# Patient Record
Sex: Male | Born: 1940 | Race: White | Hispanic: No | Marital: Married | Smoking: Former smoker
Health system: Southern US, Community
[De-identification: ages and names within clinical notes are randomized; demographics above are authoritative.]

## PROBLEM LIST (undated history)

## (undated) DIAGNOSIS — I10 Essential (primary) hypertension: Secondary | ICD-10-CM

## (undated) DIAGNOSIS — Z970 Presence of artificial eye: Secondary | ICD-10-CM

## (undated) DIAGNOSIS — C6290 Malignant neoplasm of unspecified testis, unspecified whether descended or undescended: Secondary | ICD-10-CM

## (undated) HISTORY — DX: Malignant neoplasm of unspecified testis, unspecified whether descended or undescended: C62.90

## (undated) HISTORY — PX: HEMORRHOID SURGERY: SHX153

## (undated) HISTORY — PX: OTHER SURGICAL HISTORY: SHX169

## (undated) HISTORY — PX: BREAST SURGERY: SHX581

## (undated) HISTORY — PX: ORCHIECTOMY: SHX2116

## (undated) HISTORY — PX: CATARACT EXTRACTION, BILATERAL: SHX1313

## (undated) HISTORY — PX: TONSILLECTOMY: SUR1361

---

## 2001-01-10 ENCOUNTER — Encounter: Payer: Self-pay | Admitting: Family Medicine

## 2001-01-10 ENCOUNTER — Ambulatory Visit (HOSPITAL_COMMUNITY): Admission: RE | Admit: 2001-01-10 | Discharge: 2001-01-10 | Payer: Self-pay | Admitting: Family Medicine

## 2002-05-30 ENCOUNTER — Encounter: Payer: Self-pay | Admitting: Family Medicine

## 2002-05-30 ENCOUNTER — Ambulatory Visit (HOSPITAL_COMMUNITY): Admission: RE | Admit: 2002-05-30 | Discharge: 2002-05-30 | Payer: Self-pay | Admitting: Family Medicine

## 2011-04-22 ENCOUNTER — Encounter: Payer: Self-pay | Admitting: Gastroenterology

## 2014-01-03 ENCOUNTER — Encounter: Payer: Self-pay | Admitting: Gastroenterology

## 2014-05-08 ENCOUNTER — Ambulatory Visit
Admission: RE | Admit: 2014-05-08 | Discharge: 2014-05-08 | Disposition: A | Discharge status: Home / Self Care | Payer: 59 | Source: Ambulatory Visit | Attending: Family Medicine | Admitting: Family Medicine

## 2014-05-08 ENCOUNTER — Other Ambulatory Visit: Payer: Self-pay | Admitting: Family Medicine

## 2014-05-08 DIAGNOSIS — J9801 Acute bronchospasm: Secondary | ICD-10-CM

## 2014-08-22 ENCOUNTER — Encounter: Payer: Self-pay | Admitting: Gastroenterology

## 2015-11-28 ENCOUNTER — Other Ambulatory Visit: Payer: Self-pay | Admitting: Family Medicine

## 2015-11-28 ENCOUNTER — Ambulatory Visit
Admission: RE | Admit: 2015-11-28 | Discharge: 2015-11-28 | Disposition: A | Discharge status: Home / Self Care | Payer: PPO | Source: Ambulatory Visit | Attending: Family Medicine | Admitting: Family Medicine

## 2015-11-28 DIAGNOSIS — M545 Low back pain, unspecified: Secondary | ICD-10-CM

## 2015-11-28 DIAGNOSIS — G8929 Other chronic pain: Secondary | ICD-10-CM

## 2016-03-17 DIAGNOSIS — H60333 Swimmer's ear, bilateral: Secondary | ICD-10-CM | POA: Diagnosis not present

## 2016-03-17 DIAGNOSIS — H6123 Impacted cerumen, bilateral: Secondary | ICD-10-CM | POA: Diagnosis not present

## 2016-03-17 DIAGNOSIS — J342 Deviated nasal septum: Secondary | ICD-10-CM | POA: Diagnosis not present

## 2016-03-17 DIAGNOSIS — J310 Chronic rhinitis: Secondary | ICD-10-CM | POA: Diagnosis not present

## 2016-09-17 ENCOUNTER — Other Ambulatory Visit: Payer: Self-pay

## 2016-09-17 DIAGNOSIS — R030 Elevated blood-pressure reading, without diagnosis of hypertension: Secondary | ICD-10-CM | POA: Insufficient documentation

## 2016-09-17 DIAGNOSIS — F17201 Nicotine dependence, unspecified, in remission: Secondary | ICD-10-CM | POA: Insufficient documentation

## 2016-09-17 DIAGNOSIS — E7800 Pure hypercholesterolemia, unspecified: Secondary | ICD-10-CM | POA: Insufficient documentation

## 2016-09-17 DIAGNOSIS — K449 Diaphragmatic hernia without obstruction or gangrene: Secondary | ICD-10-CM | POA: Insufficient documentation

## 2016-09-17 DIAGNOSIS — J309 Allergic rhinitis, unspecified: Secondary | ICD-10-CM | POA: Insufficient documentation

## 2016-09-17 DIAGNOSIS — K625 Hemorrhage of anus and rectum: Secondary | ICD-10-CM | POA: Insufficient documentation

## 2016-09-17 DIAGNOSIS — K219 Gastro-esophageal reflux disease without esophagitis: Secondary | ICD-10-CM | POA: Insufficient documentation

## 2016-09-17 DIAGNOSIS — N529 Male erectile dysfunction, unspecified: Secondary | ICD-10-CM | POA: Insufficient documentation

## 2016-09-17 DIAGNOSIS — M545 Low back pain, unspecified: Secondary | ICD-10-CM | POA: Insufficient documentation

## 2016-09-17 DIAGNOSIS — N400 Enlarged prostate without lower urinary tract symptoms: Secondary | ICD-10-CM | POA: Insufficient documentation

## 2016-09-17 DIAGNOSIS — R972 Elevated prostate specific antigen [PSA]: Secondary | ICD-10-CM | POA: Insufficient documentation

## 2016-09-17 DIAGNOSIS — Z8547 Personal history of malignant neoplasm of testis: Secondary | ICD-10-CM | POA: Insufficient documentation

## 2016-09-17 DIAGNOSIS — J9801 Acute bronchospasm: Secondary | ICD-10-CM | POA: Insufficient documentation

## 2016-09-18 ENCOUNTER — Ambulatory Visit (INDEPENDENT_AMBULATORY_CARE_PROVIDER_SITE_OTHER): Payer: PPO | Admitting: Internal Medicine

## 2016-09-18 ENCOUNTER — Ambulatory Visit (INDEPENDENT_AMBULATORY_CARE_PROVIDER_SITE_OTHER)
Admission: RE | Admit: 2016-09-18 | Discharge: 2016-09-18 | Disposition: A | Discharge status: Home / Self Care | Payer: PPO | Source: Ambulatory Visit | Attending: Internal Medicine | Admitting: Internal Medicine

## 2016-09-18 ENCOUNTER — Encounter: Payer: Self-pay | Admitting: Internal Medicine

## 2016-09-18 ENCOUNTER — Other Ambulatory Visit (INDEPENDENT_AMBULATORY_CARE_PROVIDER_SITE_OTHER): Payer: PPO

## 2016-09-18 VITALS — BP 152/70 | HR 55 | Ht 68.0 in | Wt 169.6 lb

## 2016-09-18 DIAGNOSIS — R059 Cough, unspecified: Secondary | ICD-10-CM

## 2016-09-18 DIAGNOSIS — R05 Cough: Secondary | ICD-10-CM

## 2016-09-18 DIAGNOSIS — R0602 Shortness of breath: Secondary | ICD-10-CM | POA: Diagnosis not present

## 2016-09-18 DIAGNOSIS — J45991 Cough variant asthma: Secondary | ICD-10-CM | POA: Insufficient documentation

## 2016-09-18 LAB — CBC WITH DIFFERENTIAL/PLATELET
BASOS PCT: 0.6 % (ref 0.0–3.0)
Basophils Absolute: 0.1 10*3/uL (ref 0.0–0.1)
EOS ABS: 0.2 10*3/uL (ref 0.0–0.7)
Eosinophils Relative: 3.1 % (ref 0.0–5.0)
HEMATOCRIT: 43.3 % (ref 39.0–52.0)
HEMOGLOBIN: 14.7 g/dL (ref 13.0–17.0)
Lymphocytes Relative: 26.5 % (ref 12.0–46.0)
Lymphs Abs: 2.1 10*3/uL (ref 0.7–4.0)
MCHC: 34 g/dL (ref 30.0–36.0)
MCV: 88 fl (ref 78.0–100.0)
MONO ABS: 0.5 10*3/uL (ref 0.1–1.0)
Monocytes Relative: 6.2 % (ref 3.0–12.0)
Neutro Abs: 5 10*3/uL (ref 1.4–7.7)
Neutrophils Relative %: 63.6 % (ref 43.0–77.0)
Platelets: 233 10*3/uL (ref 150.0–400.0)
RBC: 4.92 Mil/uL (ref 4.22–5.81)
RDW: 13.8 % (ref 11.5–15.5)
WBC: 7.8 10*3/uL (ref 4.0–10.5)

## 2016-09-18 LAB — NITRIC OXIDE: NITRIC OXIDE: 145

## 2016-09-18 MED ORDER — PREDNISONE 10 MG PO TABS: ORAL_TABLET | ORAL | 0 refills | Status: DC

## 2016-09-18 MED ORDER — FAMOTIDINE 20 MG PO TABS: ORAL_TABLET | ORAL | Status: DC

## 2016-09-18 NOTE — Patient Instructions (Signed)
Prednisone 10 mg take  4 each am x 2 days,   2 each am x 2 days,  1 each am x 2 days and stop   Prevacid 30 mg    Take  30-60 min before first meal of the day and Pepcid (famotidine)  20 mg one @  bedtime until return to office - this is the best way to tell whether stomach acid is contributing to your problem.    GERD (REFLUX)  is an extremely common cause of respiratory symptoms just like yours , many times with no obvious heartburn at all.    It can be treated with medication, but also with lifestyle changes including elevation of the head of your bed (ideally with 6 inch  bed blocks),  Smoking cessation, avoidance of late meals, excessive alcohol, and avoid fatty foods, chocolate, peppermint, colas, red wine, and acidic juices such as orange juice.  NO MINT OR MENTHOL PRODUCTS SO NO COUGH DROPS   USE SUGARLESS CANDY INSTEAD (Jolley ranchers or Stover's or Life Savers) or even ice chips will also do - the key is to swallow to prevent all throat clearing. NO OIL BASED VITAMINS - use powdered substitutes.  Please see patient coordinator before you leave today  to schedule sinus CT   Please remember to go to the lab and x-ray department downstairs for your tests - we will call you with the results when they are available.     Please schedule a follow up office visit in 2 weeks, sooner if needed

## 2016-09-18 NOTE — Progress Notes (Signed)
Subjective:    Patient ID: Juan Parrish, male    DOB: 1941-05-17,   MRN: [id number]  HPI  64 yowm light smoker quit 2014 s obvious sequelae with onset sob/wheezing fall 2016 referred to pulmonary clinic 09/18/2016 by Dr   Maury Dus   09/18/2016 1st [facility] office visit/ Juan Parrish   Chief Complaint  Patient presents with  . PULMONARY CONSULT    Referred by Dr. Maury Dus. Pt c/o wheezing, cough and SOB for the past 10 months. He states he has to clear his throat frequently, and sputum is clear in color. He states that he gets winded with exertion such as push mowing, but is able to perform desired activities without his breathing stopping him.   onset fall 2016 with sensation of pnds much worse at bedtime assoc with daytime sense of wheeze but very little acutal mucus production and really Not limited by breathing from desired activities  Even though admits more sob with exertion   No response to singulair / nasocort/ powder inhaler/ symbicort  On prevacid x years not sure of dose but takes in evening not timed with meals   No obvious  day to day or daytime variabilty or assoc chronic cough or cp or chest tightness, subjective wheeze overt sinus or hb symptoms. No unusual exp hx or h/o childhood pna/ asthma or knowledge of premature birth.  Sleeping ok without nocturnal  or early am exacerbation  of respiratory  c/o's or need for noct saba. Also denies any obvious fluctuation of symptoms with weather or environmental changes or other aggravating or alleviating factors except as outlined above   Current Medications, Allergies, Complete Past Medical History, Past Surgical History, Family History, and Social History were reviewed in [employer] record.          Review of Systems  Constitutional: Negative for activity change, appetite change, chills, fever and unexpected weight change.  HENT: Negative for congestion, dental problem, postnasal drip,  rhinorrhea, sneezing, sore throat, trouble swallowing and voice change.   Eyes: Negative for visual disturbance.  Respiratory: Positive for cough and shortness of breath. Negative for choking.   Cardiovascular: Negative for chest pain and leg swelling.  Gastrointestinal: Negative for abdominal pain, nausea and vomiting.  Genitourinary: Negative for difficulty urinating.  Musculoskeletal: Negative for arthralgias.  Skin: Negative for rash.  Psychiatric/Behavioral: Negative for behavioral problems and confusion.       Objective:   Physical Exam   amb wf nad with classic pseudowheeze   Wt Readings from Last 3 Encounters:  09/18/16 169 lb 9.6 oz (76.9 kg)    Vital signs reviewed    HEENT: nl dentition, turbinates, and oropharynx with min mucoid pnd. Nl external ear canals without cough reflex   NECK :  without JVD/Nodes/TM/ nl carotid upstrokes bilaterally   LUNGS: no acc muscle use,  Nl contour chest which is clear to A and P with prominent pseudowheeze  CV:  RRR  no s3 or murmur or increase in P2, no edema   ABD:  soft and nontender with nl inspiratory excursion in the supine position. No bruits or organomegaly, bowel sounds nl  MS:  Nl gait/ ext warm without deformities, calf tenderness, cyanosis or clubbing No obvious joint restrictions   SKIN: warm and dry without lesions    NEURO:  alert, approp, nl sensorium with  no motor deficits   CXR PA and Lateral:   09/18/2016 :    I personally reviewed images and  agree with radiology impression as follows:    There is slight left base atelectasis. Lungs elsewhere are clear. Heart size and pulmonary vascularity are normal. No adenopathy. There is mild degenerative change in the thoracic spine My review : no significant atx .   Labs ordered 09/18/2016 = allergy profile    Assessment & Plan:

## 2016-09-19 NOTE — Assessment & Plan Note (Signed)
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 Related to poorly controlled rhinitis or reflux or both  The most common causes of chronic cough in immunocompetent adults include the following: upper airway cough syndrome (UACS), previously referred to as postnasal drip syndrome (PNDS), which is caused by variety of rhinosinus conditions; (2) asthma; (3) GERD; (4) chronic bronchitis from cigarette smoking or other inhaled environmental irritants; (5) nonasthmatic eosinophilic bronchitis; and (6) bronchiectasis.   These conditions, singly or in combination, have accounted for up to 94% of the causes of chronic cough in prospective studies.   Other conditions have constituted no >6% of the causes in prospective studies These have included bronchogenic carcinoma, chronic interstitial pneumonia, sarcoidosis, left ventricular failure, ACEI-induced cough, and aspiration from a condition associated with pharyngeal dysfunction.    Chronic cough is often simultaneously caused by more than one condition. A single cause has been found from 38 to 82% of the time, multiple causes from 18 to 62%. Multiply caused cough has been the result of three diseases up to 42% of the time.       Suspect this problems is therefore multifactorial and will start with just a short course of prednsone and w/u for allergy while max rx for gerd / sinus dz and regroup in just 2 weeks - in meantime needs to work hard on not clearing his throat which just amplifies the problem of cyclical cough   Total time devoted to counseling  = 35/2m review case with pt/ discussion of options/alternatives/ personally creating written instructions  in presence of pt  then going over those specific  Instructions directly with the pt including how to use all of the meds but  in particular covering each new medication in detail and the difference between the maintenance/automatic meds and the prns using an action plan format for the latter.

## 2016-09-21 LAB — RESPIRATORY ALLERGY PROFILE REGION II ~~LOC~~
Allergen, A. alternata, m6: <0.1 kU/L
Allergen, C. Herbarum, M2: <0.1 kU/L
Allergen, D pternoyssinus,d7: <0.1 kU/L
Allergen, Mulberry, t76: <0.1 kU/L
Allergen, Oak,t7: <0.1 kU/L
Box Elder IgE: <0.1 kU/L
Cat Dander: <0.1 kU/L
Cockroach: <0.1 kU/L
D. farinae: <0.1 kU/L
Dog Dander: <0.1 kU/L
IGE (IMMUNOGLOBULIN E), SERUM: 112 kU/L (ref <115)
Johnson Grass: <0.1 kU/L
Pecan/Hickory Tree IgE: <0.1 kU/L
Rough Pigweed  IgE: <0.1 kU/L
Sheep Sorrel IgE: <0.1 kU/L
Timothy Grass: <0.1 kU/L

## 2016-09-21 NOTE — Progress Notes (Signed)
Spoke with pt and notified of results per Dr. Wert. Pt verbalized understanding and denied any questions. 

## 2016-09-22 ENCOUNTER — Ambulatory Visit (INDEPENDENT_AMBULATORY_CARE_PROVIDER_SITE_OTHER)
Admission: RE | Admit: 2016-09-22 | Discharge: 2016-09-22 | Disposition: A | Discharge status: Home / Self Care | Payer: PPO | Source: Ambulatory Visit | Attending: Internal Medicine | Admitting: Internal Medicine

## 2016-09-22 DIAGNOSIS — R05 Cough: Secondary | ICD-10-CM

## 2016-09-22 DIAGNOSIS — J328 Other chronic sinusitis: Secondary | ICD-10-CM | POA: Diagnosis not present

## 2016-09-22 DIAGNOSIS — R059 Cough, unspecified: Secondary | ICD-10-CM

## 2016-09-23 NOTE — Progress Notes (Signed)
Spoke with the pt's spouse and notified of results and she verbalized understanding and will inform the pt

## 2016-09-29 ENCOUNTER — Other Ambulatory Visit: Payer: PPO

## 2016-09-29 ENCOUNTER — Encounter: Payer: Self-pay | Admitting: Internal Medicine

## 2016-09-29 ENCOUNTER — Ambulatory Visit (INDEPENDENT_AMBULATORY_CARE_PROVIDER_SITE_OTHER): Payer: PPO | Admitting: Internal Medicine

## 2016-09-29 VITALS — BP 126/80 | HR 58 | Ht 68.0 in | Wt 170.0 lb

## 2016-09-29 DIAGNOSIS — Z23 Encounter for immunization: Secondary | ICD-10-CM | POA: Diagnosis not present

## 2016-09-29 DIAGNOSIS — J45991 Cough variant asthma: Secondary | ICD-10-CM | POA: Diagnosis not present

## 2016-09-29 MED ORDER — MOMETASONE FURO-FORMOTEROL FUM 100-5 MCG/ACT IN AERO: 2.0000 | INHALATION_SPRAY | Freq: Two times a day (BID) | RESPIRATORY_TRACT | 11 refills | Status: DC

## 2016-09-29 MED ORDER — MOMETASONE FURO-FORMOTEROL FUM 100-5 MCG/ACT IN AERO: 2.0000 | INHALATION_SPRAY | Freq: Two times a day (BID) | RESPIRATORY_TRACT | 0 refills | Status: DC

## 2016-09-29 NOTE — Assessment & Plan Note (Signed)
Spirometry 09/18/2016  wnl including fef 25-75 while actively "wheezing" -  FENO 09/18/2016  =   145 - Allergy profile 09/18/16  >  Eos 0.2 /  IgE  112 neg RAST  - Sinus CT 09/22/16 > Mild opacification and a superior anterior left ethmoid air cell. Paranasal sinuses elsewhere clear on this limited study. 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  Please see instructions for details which were reviewed in writing and the patient given a copy.     Pulmonary f/u is prn

## 2016-09-29 NOTE — Progress Notes (Signed)
Subjective:    Patient ID: Juan Parrish, male    DOB: May 20, 1941,   MRN: [id number]    Brief patient profile:  89 yowm light smoker quit 2014 s obvious sequelae with onset sob/wheezing fall 2016 referred to pulmonary clinic 09/18/2016 by Dr   Maury Dus  History of Present Illness  09/18/2016 1st [facility] office visit/ Wert   Chief Complaint  Patient presents with  . PULMONARY CONSULT    Referred by Dr. Maury Dus. Pt c/o wheezing, cough and SOB for the past 10 months. He states he has to clear his throat frequently, and sputum is clear in color. He states that he gets winded with exertion such as push mowing, but is able to perform desired activities without his breathing stopping him.   onset fall 2016 with sensation of pnds much worse at bedtime assoc with daytime sense of wheeze but very little acutal mucus production and really Not limited by breathing from desired activities  Even though admits more sob with exertion  No response to singulair / nasocort/ powder inhaler/ symbicort  On prevacid x years not sure of dose but takes in evening not timed with meals rec Prednisone 10 mg take  4 each am x 2 days,   2 each am x 2 days,  1 each am x 2 days and stop  Prevacid 30 mg    Take  30-60 min before first meal of the day and Pepcid (famotidine)  20 mg one @  bedtime until return to office - this is the best way to tell whether stomach acid is contributing to your problem.   GERD diet  Please see patient coordinator before you leave today  to schedule sinus CT  Please remember to go to the lab and x-ray department downstairs for your tests - we will call you with the results when they are available.    09/29/2016  f/u ov/Wert re: cough since fall on 2016  ? Cough variant asthma vs uacs  Chief Complaint  Patient presents with  . Follow-up    Cough has improved some. No new co's today.   50% improved but still present esp at hs assoc with pnds Not limited by breathing  from desired activities    No obvious day to day or daytime variability or assoc excess/ purulent sputum or mucus plugs or hemoptysis or cp or chest tightness, subjective wheeze or overt sinus or hb symptoms. No unusual exp hx or h/o childhood pna/ asthma or knowledge of premature birth.  Sleeping ok without nocturnal  or early am exacerbation  of respiratory  c/o's or need for noct saba. Also denies any obvious fluctuation of symptoms with weather or environmental changes or other aggravating or alleviating factors except as outlined above   Current Medications, Allergies, Complete Past Medical History, Past Surgical History, Family History, and Social History were reviewed in [employer] record.  ROS  The following are not active complaints unless bolded sore throat, dysphagia, dental problems, itching, sneezing,  nasal congestion or excess/ purulent secretions, ear ache,   fever, chills, sweats, unintended wt loss, classically pleuritic or exertional cp,  orthopnea pnd or leg swelling, presyncope, palpitations, abdominal pain, anorexia, nausea, vomiting, diarrhea  or change in bowel or bladder habits, change in stools or urine, dysuria,hematuria,  rash, arthralgias, visual complaints, headache, numbness, weakness or ataxia or problems with walking or coordination,  change in mood/affect or memory.  Objective:   Physical Exam   amb wf nad      Wt Readings from Last 3 Encounters:  09/29/16 170 lb (77.1 kg)  09/18/16 169 lb 9.6 oz (76.9 kg)    Vital signs reviewed   HEENT: nl dentition, turbinates, and oropharynx with min mucoid pnd. Nl external ear canals without cough reflex   NECK :  without JVD/Nodes/TM/ nl carotid upstrokes bilaterally   LUNGS: no acc muscle use,  Nl contour chest which is clear to A and P with less pseudowheeze  / some end exp wheeze  CV:  RRR  no s3 or murmur or increase in P2, no edema   ABD:  soft and  nontender with nl inspiratory excursion in the supine position. No bruits or organomegaly, bowel sounds nl  MS:  Nl gait/ ext warm without deformities, calf tenderness, cyanosis or clubbing No obvious joint restrictions   SKIN: warm and dry without lesions    NEURO:  alert, approp, nl sensorium with  no motor deficits   CXR PA and Lateral:   09/18/2016 :    I personally reviewed images and agree with radiology impression as follows:    There is slight left base atelectasis. Lungs elsewhere are clear. Heart size and pulmonary vascularity are normal. No adenopathy. There is mild degenerative change in the thoracic spine My review : no significant atx .        Assessment & Plan:

## 2016-09-29 NOTE — Patient Instructions (Addendum)
No change in any medications except for adding dulera 100 Take 2 puffs first thing in am and then another 2 puffs about 12 hours later.   Work on inhaler technique:  relax and gently blow all the way out then take a nice smooth deep breath back in, triggering the inhaler at same time you start breathing in.  Hold for up to 5 seconds if you can. Blow out thru nose. Rinse and gargle with water when done  For drainage / throat tickle try take CHLORPHENIRAMINE  4 mg - take one every 4 hours as needed - available over the counter- may cause drowsiness so start with just a bedtime dose or two and see how you tolerate it before trying in daytime      If not better next is referral to allergy - call me for this.  If better tell Dr Alyson Ingles and your friends and stay on this regimen until you see him back for follow up

## 2016-12-23 DIAGNOSIS — M545 Low back pain: Secondary | ICD-10-CM | POA: Diagnosis not present

## 2016-12-23 DIAGNOSIS — N529 Male erectile dysfunction, unspecified: Secondary | ICD-10-CM | POA: Diagnosis not present

## 2016-12-23 DIAGNOSIS — J9801 Acute bronchospasm: Secondary | ICD-10-CM | POA: Diagnosis not present

## 2016-12-23 DIAGNOSIS — K219 Gastro-esophageal reflux disease without esophagitis: Secondary | ICD-10-CM | POA: Diagnosis not present

## 2016-12-23 DIAGNOSIS — R42 Dizziness and giddiness: Secondary | ICD-10-CM | POA: Diagnosis not present

## 2016-12-23 DIAGNOSIS — J309 Allergic rhinitis, unspecified: Secondary | ICD-10-CM | POA: Diagnosis not present

## 2016-12-23 DIAGNOSIS — E7800 Pure hypercholesterolemia, unspecified: Secondary | ICD-10-CM | POA: Diagnosis not present

## 2016-12-23 DIAGNOSIS — F17201 Nicotine dependence, unspecified, in remission: Secondary | ICD-10-CM | POA: Diagnosis not present

## 2016-12-23 DIAGNOSIS — Z1389 Encounter for screening for other disorder: Secondary | ICD-10-CM | POA: Diagnosis not present

## 2016-12-23 DIAGNOSIS — Z8547 Personal history of malignant neoplasm of testis: Secondary | ICD-10-CM | POA: Diagnosis not present

## 2016-12-23 DIAGNOSIS — N400 Enlarged prostate without lower urinary tract symptoms: Secondary | ICD-10-CM | POA: Diagnosis not present

## 2016-12-23 DIAGNOSIS — Z0000 Encounter for general adult medical examination without abnormal findings: Secondary | ICD-10-CM | POA: Diagnosis not present

## 2016-12-24 ENCOUNTER — Other Ambulatory Visit: Payer: Self-pay | Admitting: Family Medicine

## 2016-12-24 DIAGNOSIS — Z136 Encounter for screening for cardiovascular disorders: Secondary | ICD-10-CM

## 2017-01-01 ENCOUNTER — Ambulatory Visit
Admission: RE | Admit: 2017-01-01 | Discharge: 2017-01-01 | Disposition: A | Discharge status: Home / Self Care | Payer: PPO | Source: Ambulatory Visit | Attending: Family Medicine | Admitting: Family Medicine

## 2017-01-01 DIAGNOSIS — Z87891 Personal history of nicotine dependence: Secondary | ICD-10-CM | POA: Diagnosis not present

## 2017-01-01 DIAGNOSIS — Z136 Encounter for screening for cardiovascular disorders: Secondary | ICD-10-CM

## 2017-01-01 DIAGNOSIS — Z8249 Family history of ischemic heart disease and other diseases of the circulatory system: Secondary | ICD-10-CM | POA: Diagnosis not present

## 2017-01-20 DIAGNOSIS — H524 Presbyopia: Secondary | ICD-10-CM | POA: Diagnosis not present

## 2017-01-20 DIAGNOSIS — H52222 Regular astigmatism, left eye: Secondary | ICD-10-CM | POA: Diagnosis not present

## 2017-01-20 DIAGNOSIS — H5202 Hypermetropia, left eye: Secondary | ICD-10-CM | POA: Diagnosis not present

## 2017-01-25 DIAGNOSIS — R972 Elevated prostate specific antigen [PSA]: Secondary | ICD-10-CM | POA: Diagnosis not present

## 2017-03-11 DIAGNOSIS — R972 Elevated prostate specific antigen [PSA]: Secondary | ICD-10-CM | POA: Diagnosis not present

## 2017-03-11 DIAGNOSIS — K648 Other hemorrhoids: Secondary | ICD-10-CM | POA: Diagnosis not present

## 2017-03-11 DIAGNOSIS — N5201 Erectile dysfunction due to arterial insufficiency: Secondary | ICD-10-CM | POA: Diagnosis not present

## 2017-03-11 DIAGNOSIS — R3912 Poor urinary stream: Secondary | ICD-10-CM | POA: Diagnosis not present

## 2017-03-23 DIAGNOSIS — H43812 Vitreous degeneration, left eye: Secondary | ICD-10-CM | POA: Diagnosis not present

## 2017-03-23 DIAGNOSIS — H2702 Aphakia, left eye: Secondary | ICD-10-CM | POA: Diagnosis not present

## 2017-04-14 DIAGNOSIS — K648 Other hemorrhoids: Secondary | ICD-10-CM | POA: Diagnosis not present

## 2017-05-06 DIAGNOSIS — K648 Other hemorrhoids: Secondary | ICD-10-CM | POA: Diagnosis not present

## 2017-07-01 ENCOUNTER — Encounter: Payer: Self-pay | Admitting: Internal Medicine

## 2017-07-01 ENCOUNTER — Ambulatory Visit (INDEPENDENT_AMBULATORY_CARE_PROVIDER_SITE_OTHER): Payer: PPO | Admitting: Internal Medicine

## 2017-07-01 DIAGNOSIS — J45991 Cough variant asthma: Secondary | ICD-10-CM | POA: Diagnosis not present

## 2017-07-01 MED ORDER — BUDESONIDE-FORMOTEROL FUMARATE 80-4.5 MCG/ACT IN AERO: 2.0000 | INHALATION_SPRAY | Freq: Two times a day (BID) | RESPIRATORY_TRACT | 11 refills | Status: DC

## 2017-07-01 MED ORDER — BUDESONIDE-FORMOTEROL FUMARATE 160-4.5 MCG/ACT IN AERO: 2.0000 | INHALATION_SPRAY | Freq: Two times a day (BID) | RESPIRATORY_TRACT | 0 refills | Status: DC

## 2017-07-01 NOTE — Progress Notes (Signed)
Subjective:   Patient ID: Juan Parrish, male    DOB: [birth year],   MRN: [id number]    Brief patient profile:  30   yowm only a light smoker quit 2014 s obvious sequelae with onset sob/wheezing fall 2016 referred to pulmonary clinic 09/18/2016 by Dr  Maury Dus    History of Present Illness  09/18/2016 1st [facility] office visit/ Wert   Chief Complaint  Patient presents with  . PULMONARY CONSULT    Referred by Dr. Maury Dus. Pt c/o wheezing, cough and SOB for the past 10 months. He states he has to clear his throat frequently, and sputum is clear in color. He states that he gets winded with exertion such as push mowing, but is able to perform desired activities without his breathing stopping him.   onset fall 2016 with sensation of pnds much worse at bedtime assoc with daytime sense of wheeze but very little acutal mucus production and really Not limited by breathing from desired activities  Even though admits more sob with exertion  No response to singulair / nasocort/ powder inhaler/ symbicort  On prevacid x years not sure of dose but takes in evening not timed with meals rec Prednisone 10 mg take  4 each am x 2 days,   2 each am x 2 days,  1 each am x 2 days and stop  Prevacid 30 mg    Take  30-60 min before first meal of the day and Pepcid (famotidine)  20 mg one @  bedtime until return to office - this is the best way to tell whether stomach acid is contributing to your problem.   GERD diet  Please see patient coordinator before you leave today  to schedule sinus CT  Please remember to go to the lab and x-ray department downstairs for your tests - we will call you with the results when they are available.    09/29/2016  f/u ov/Wert re: cough since fall on 2016  ? Cough variant asthma vs uacs  Chief Complaint  Patient presents with  . Follow-up    Cough has improved some. No new co's today.   50% improved but still present esp at hs assoc with pnds Not limited by  breathing from desired activities   rec No change in any medications except for adding dulera 100 Take 2 puffs first thing in am and then another 2 puffs about 12 hours later.  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 Acute Visit    Pt c/o wheezing and congestion in his throat "for months" worse the past 6 wks.    symtpoms of cough / wheeze indolent persistnent daily since late  April 2018 rx with loratidine / pepcid 20 mg at hs  Did not rememer / activate any other portion of plan Main complaint is something is stuck in his throat he can't cough up but more day than noct and though has "wheeze" really Not limited by breathing from desired activities    No obvious day to day or daytime variability or assoc excess/ purulent sputum or mucus plugs or hemoptysis or cp or chest tightness,  or overt  sinus or hb symptoms. No unusual exp hx or h/o childhood pna/ asthma or knowledge of premature birth.  Sleeping ok without nocturnal  or early am exacerbation  of respiratory  c/o's or need for noct saba. Also denies any obvious fluctuation of symptoms with weather or environmental changes or other aggravating or alleviating factors except as outlined above   Current Medications, Allergies, Complete Past Medical History, Past Surgical History, Family History, and Social History were reviewed in [employer] record.  ROS  The following are not active complaints unless bolded sore throat, dysphagia, dental problems, itching, sneezing,  nasal congestion or excess/ purulent secretions, ear ache,   fever, chills, sweats, unintended wt loss, classically pleuritic or exertional cp,  orthopnea pnd or leg  swelling, presyncope, palpitations, abdominal pain, anorexia, nausea, vomiting, diarrhea  or change in bowel or bladder habits, change in stools or urine, dysuria,hematuria,  rash, arthralgias, visual complaints, headache, numbness, weakness or ataxia or problems with walking or coordination,  change in mood/affect or memory.                      Objective:   Physical Exam   amb wf nad    Wt Readings from Last 3 Encounters:  07/01/17 175 lb (79.4 kg)  09/29/16 170 lb (77.1 kg)  09/18/16 169 lb 9.6 oz (76.9 kg)    Vital signs reviewed  - Note on arrival 02 sats  97% on RA      HEENT: nl dentition, turbinates, and oropharynx with min mucoid pnd. Nl external ear canals without cough reflex   NECK :  without JVD/Nodes/TM/ nl carotid upstrokes bilaterally   LUNGS: no acc muscle use,  Nl contour with distant mid/late exp wheeze bilaterally worse with FVC with significant upper airway component    CV:  RRR  no s3 or murmur or increase in P2, no edema   ABD:  soft and nontender with nl inspiratory excursion in the supine position. No bruits or organomegaly, bowel sounds nl  MS:  Nl gait/ ext warm without deformities, calf tenderness, cyanosis or clubbing No obvious joint restrictions   SKIN: warm and dry without lesions    NEURO:  alert, approp, nl sensorium with  no motor deficits     Assessment & Plan:

## 2017-07-01 NOTE — Patient Instructions (Addendum)
With any flare of cough/ wheeze/ phlegm building in throat > symbicort 80  Take 2 puffs first thing in am and then another 2 puffs about 12 hours later.   If not improving > Prednisone 10 mg take  4 each am x 2 days,   2 each am x 2 days,  1 each am x 2 days and stop   If still not improving > prilosec 20 mg x 2 take Take 30-60 min before first meal of the day   For drainage / throat tickle try take CHLORPHENIRAMINE  4 mg - take one every 4 hours as needed - available over the counter- may cause drowsiness so start with just a bedtime dose or two and see how you tolerate it before trying in daytime     If you are satisfied with your treatment plan,  let your doctor know and he/she can either refill your medications or you can return here when your prescription runs out.     If in any way you are not 100% satisfied,  please tell us.  If 100% better, tell your friends!  Pulmonary follow up is as needed

## 2017-07-02 NOTE — Assessment & Plan Note (Addendum)
Spirometry 09/18/2016  wnl including fef 25-75 while actively "wheezing" -  FENO 09/18/2016  =   145 - Allergy profile 09/18/16  >  Eos 0.2 /  IgE  112 neg RAST  - Sinus CT 09/22/16 > Mild opacification and a superior anterior left ethmoid air cell. Paranasal sinuses elsewhere clear on this limited study. No air-fluid level  - 09/29/2016    try dulera 100 2bid > stopped s flare until 03/2017  - 07/01/2017  After extensive coaching HFA effectiveness =  75%      With elevated FENO in past he almost certainly has an asthmatic component but it is very mild as as per the most recent studies  Ok to use short term laba/ics in this setting and if not effective add ppi in am to cover any uacs component with pred x 6 days in reserve if doesn't quickly respond to symbicort and 1st gen h1 to address sensation of pnds with f/u prn < 100% response    I had an extended discussion with the patient reviewing all relevant studies completed to date and  lasting 15 to 20 minutes of a 25 minute acute  visit    Each maintenance medication was reviewed in detail including most importantly the difference between maintenance and prns and under what circumstances the prns are to be triggered using an action plan format that is not reflected in the computer generated alphabetically organized AVS.    Please see AVS for specific instructions unique to this visit that I personally wrote and verbalized to the the pt in detail and then reviewed with pt  by my nurse highlighting any  changes in therapy recommended at today's visit to their plan of care.

## 2017-07-29 ENCOUNTER — Telehealth: Payer: Self-pay | Admitting: Internal Medicine

## 2017-07-29 ENCOUNTER — Other Ambulatory Visit: Payer: Self-pay | Admitting: Internal Medicine

## 2017-07-29 DIAGNOSIS — R05 Cough: Secondary | ICD-10-CM

## 2017-07-29 DIAGNOSIS — R059 Cough, unspecified: Secondary | ICD-10-CM

## 2017-07-29 MED ORDER — PREDNISONE 10 MG PO TABS: ORAL_TABLET | ORAL | 0 refills | Status: DC

## 2017-07-29 NOTE — Telephone Encounter (Signed)
Dr Melvyn Novas, pt wife calling in stating that the patient is needing the Prednisone taper that was offered last OV. Symptoms are not improving.   With any flare of cough/ wheeze/ phlegm building in throat > symbicort 80  Take 2 puffs first thing in am and then another 2 puffs about 12 hours later.   If not improving > Prednisone 10 mg take  4 each am x 2 days,   2 each am x 2 days,  1 each am x 2 days and stop   If still not improving > prilosec 20 mg x 2 take Take 30-60 min before first meal of the day   For drainage / throat tickle try take CHLORPHENIRAMINE  4 mg - take one every 4 hours as needed - available over the counter- may cause drowsiness so start with just a bedtime dose or two and see how you tolerate it before trying in daytime     If you are satisfied with your treatment plan,  let your doctor know and he/she can either refill your medications or you can return here when your prescription runs out.     If in any way you are not 100% satisfied,  please tell us.  If 100% better, tell your friends!  Pulmonary follow up is as needed   Please advise if Prednisone taper is okay to send at this point as stated per last OV. Thanks.

## 2017-07-29 NOTE — Telephone Encounter (Signed)
Saw that pt was requesting refill of presdnisone taper. Called pt and left a message for him to call us back so we can figure out exactly if it is the prednisone taper he is needing or if it is a maintenance dose of the prednisone that he is needing instead.

## 2017-07-29 NOTE — Telephone Encounter (Signed)
Spoke with pt's wife and informed her of MW's message. She verified which pharmacy rx needed to be sent to. Rx was sent. She had no additional questions at this time. Nothing further is needed

## 2017-07-29 NOTE — Telephone Encounter (Signed)
Prednisone 10 mg take  4 each am x 2 days,   2 each am x 2 days,  1 each am x 2 days and stop  

## 2017-08-14 DIAGNOSIS — Z970 Presence of artificial eye: Secondary | ICD-10-CM | POA: Diagnosis not present

## 2017-08-14 DIAGNOSIS — H2702 Aphakia, left eye: Secondary | ICD-10-CM | POA: Diagnosis not present

## 2017-08-14 DIAGNOSIS — H16102 Unspecified superficial keratitis, left eye: Secondary | ICD-10-CM | POA: Diagnosis not present

## 2017-08-15 IMAGING — DX DG CHEST 2V
2 series · 2 of 2 positions shown · non-contrast
Comparison: May 08, 2014

CLINICAL DATA: Shortness of breath with cough and congestion ;
wheezing

EXAM:
CHEST  2 VIEW

[chest pa]
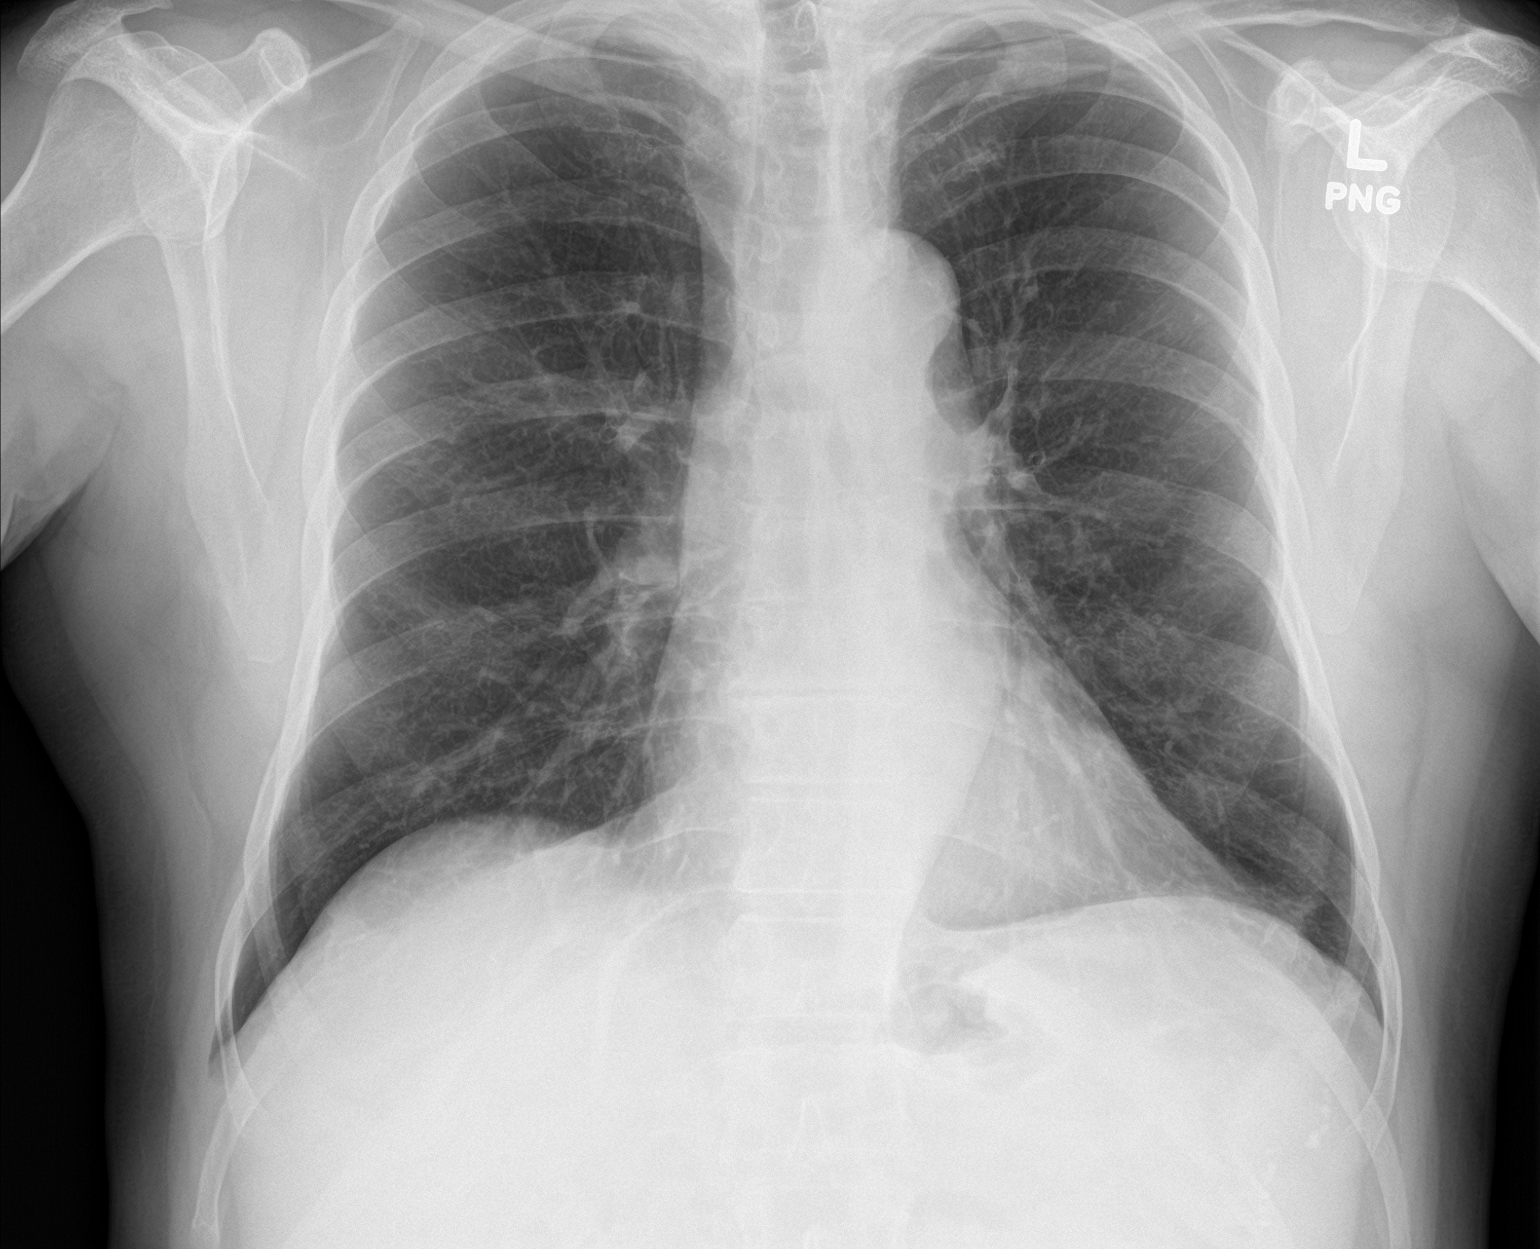

[chest lat]
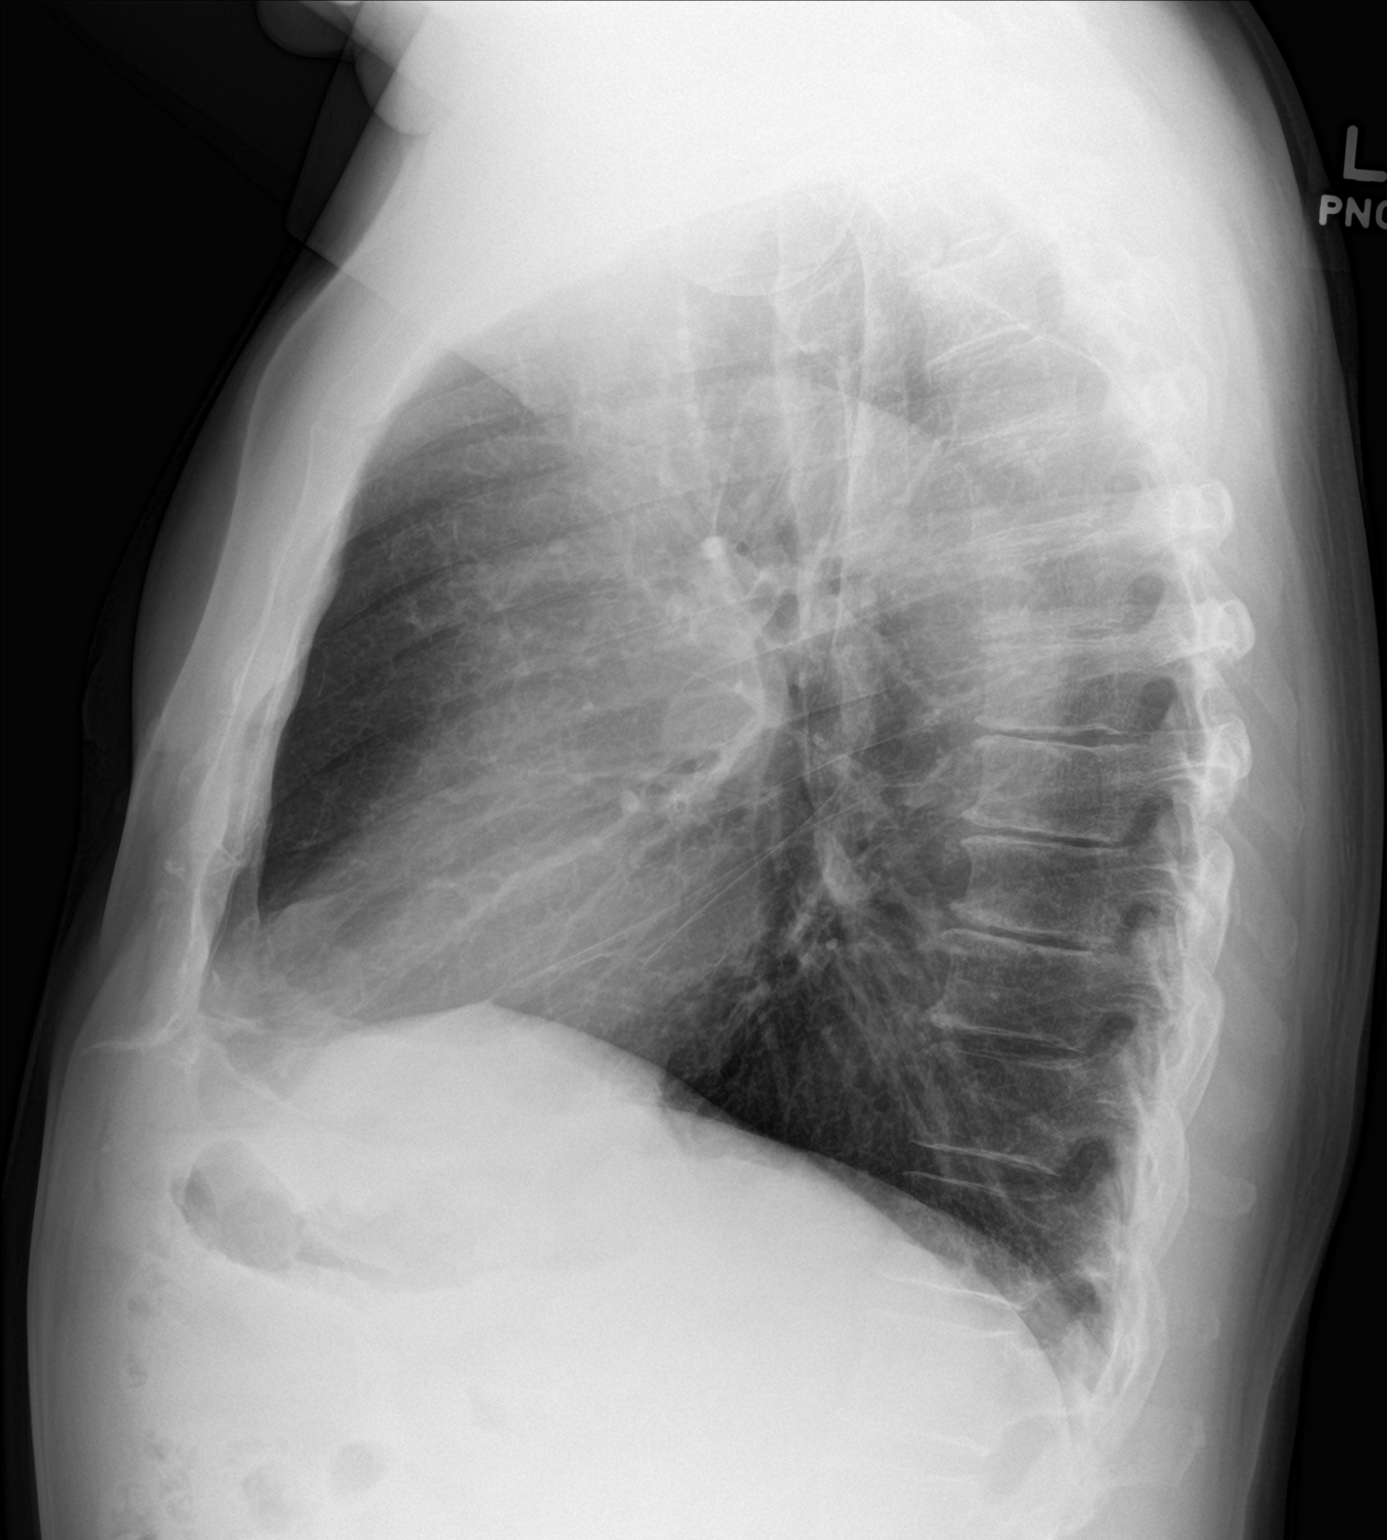

[2 of 2 positions shown; findings below may reference images not displayed]

FINDINGS: There is slight left base atelectasis. Lungs elsewhere are clear.
Heart size and pulmonary vascularity are normal. No adenopathy.
There is mild degenerative change in the thoracic spine.
IMPRESSION: Slight atelectasis left base.  No edema or consolidation.

## 2017-08-16 DIAGNOSIS — T1512XA Foreign body in conjunctival sac, left eye, initial encounter: Secondary | ICD-10-CM | POA: Diagnosis not present

## 2017-10-07 DIAGNOSIS — K641 Second degree hemorrhoids: Secondary | ICD-10-CM | POA: Diagnosis not present

## 2017-10-21 DIAGNOSIS — Z23 Encounter for immunization: Secondary | ICD-10-CM | POA: Diagnosis not present

## 2017-11-23 DIAGNOSIS — L723 Sebaceous cyst: Secondary | ICD-10-CM | POA: Diagnosis not present

## 2017-11-23 DIAGNOSIS — Z23 Encounter for immunization: Secondary | ICD-10-CM | POA: Diagnosis not present

## 2017-11-23 DIAGNOSIS — L821 Other seborrheic keratosis: Secondary | ICD-10-CM | POA: Diagnosis not present

## 2017-11-23 DIAGNOSIS — D224 Melanocytic nevi of scalp and neck: Secondary | ICD-10-CM | POA: Diagnosis not present

## 2017-11-23 DIAGNOSIS — L299 Pruritus, unspecified: Secondary | ICD-10-CM | POA: Diagnosis not present

## 2017-11-23 DIAGNOSIS — Z85828 Personal history of other malignant neoplasm of skin: Secondary | ICD-10-CM | POA: Diagnosis not present

## 2017-12-28 DIAGNOSIS — E7800 Pure hypercholesterolemia, unspecified: Secondary | ICD-10-CM | POA: Diagnosis not present

## 2017-12-28 DIAGNOSIS — F17201 Nicotine dependence, unspecified, in remission: Secondary | ICD-10-CM | POA: Diagnosis not present

## 2017-12-28 DIAGNOSIS — Z1389 Encounter for screening for other disorder: Secondary | ICD-10-CM | POA: Diagnosis not present

## 2017-12-28 DIAGNOSIS — M545 Low back pain: Secondary | ICD-10-CM | POA: Diagnosis not present

## 2017-12-28 DIAGNOSIS — K648 Other hemorrhoids: Secondary | ICD-10-CM | POA: Diagnosis not present

## 2017-12-28 DIAGNOSIS — K219 Gastro-esophageal reflux disease without esophagitis: Secondary | ICD-10-CM | POA: Diagnosis not present

## 2017-12-28 DIAGNOSIS — N529 Male erectile dysfunction, unspecified: Secondary | ICD-10-CM | POA: Diagnosis not present

## 2017-12-28 DIAGNOSIS — N400 Enlarged prostate without lower urinary tract symptoms: Secondary | ICD-10-CM | POA: Diagnosis not present

## 2017-12-28 DIAGNOSIS — J309 Allergic rhinitis, unspecified: Secondary | ICD-10-CM | POA: Diagnosis not present

## 2017-12-28 DIAGNOSIS — J9801 Acute bronchospasm: Secondary | ICD-10-CM | POA: Diagnosis not present

## 2017-12-28 DIAGNOSIS — K5909 Other constipation: Secondary | ICD-10-CM | POA: Diagnosis not present

## 2017-12-28 DIAGNOSIS — Z0000 Encounter for general adult medical examination without abnormal findings: Secondary | ICD-10-CM | POA: Diagnosis not present

## 2018-01-27 DIAGNOSIS — H903 Sensorineural hearing loss, bilateral: Secondary | ICD-10-CM | POA: Diagnosis not present

## 2018-02-03 DIAGNOSIS — H903 Sensorineural hearing loss, bilateral: Secondary | ICD-10-CM | POA: Diagnosis not present

## 2018-02-10 DIAGNOSIS — H6123 Impacted cerumen, bilateral: Secondary | ICD-10-CM | POA: Diagnosis not present

## 2018-02-10 DIAGNOSIS — H60333 Swimmer's ear, bilateral: Secondary | ICD-10-CM | POA: Diagnosis not present

## 2018-03-08 DIAGNOSIS — R972 Elevated prostate specific antigen [PSA]: Secondary | ICD-10-CM | POA: Diagnosis not present

## 2018-03-14 DIAGNOSIS — R3912 Poor urinary stream: Secondary | ICD-10-CM | POA: Diagnosis not present

## 2018-03-14 DIAGNOSIS — N401 Enlarged prostate with lower urinary tract symptoms: Secondary | ICD-10-CM | POA: Diagnosis not present

## 2018-03-14 DIAGNOSIS — R972 Elevated prostate specific antigen [PSA]: Secondary | ICD-10-CM | POA: Diagnosis not present

## 2018-06-29 DIAGNOSIS — H524 Presbyopia: Secondary | ICD-10-CM | POA: Diagnosis not present

## 2018-06-29 DIAGNOSIS — H52222 Regular astigmatism, left eye: Secondary | ICD-10-CM | POA: Diagnosis not present

## 2018-06-29 DIAGNOSIS — H5202 Hypermetropia, left eye: Secondary | ICD-10-CM | POA: Diagnosis not present

## 2018-08-01 ENCOUNTER — Ambulatory Visit: Payer: PPO | Admitting: Internal Medicine

## 2018-08-01 ENCOUNTER — Ambulatory Visit (INDEPENDENT_AMBULATORY_CARE_PROVIDER_SITE_OTHER)
Admission: RE | Admit: 2018-08-01 | Discharge: 2018-08-01 | Disposition: A | Discharge status: Home / Self Care | Payer: PPO | Source: Ambulatory Visit | Attending: Internal Medicine | Admitting: Internal Medicine

## 2018-08-01 ENCOUNTER — Encounter: Payer: Self-pay | Admitting: Internal Medicine

## 2018-08-01 VITALS — BP 136/84 | HR 85 | Ht 68.0 in | Wt 171.4 lb

## 2018-08-01 DIAGNOSIS — J45991 Cough variant asthma: Secondary | ICD-10-CM | POA: Diagnosis not present

## 2018-08-01 DIAGNOSIS — R05 Cough: Secondary | ICD-10-CM | POA: Diagnosis not present

## 2018-08-01 LAB — NITRIC OXIDE: NITRIC OXIDE: 194

## 2018-08-01 MED ORDER — OMEPRAZOLE MAGNESIUM 20 MG PO TBEC: 20.0000 mg | DELAYED_RELEASE_TABLET | Freq: Every day | ORAL | Status: DC

## 2018-08-01 MED ORDER — PREDNISONE 10 MG PO TABS
ORAL_TABLET | ORAL | 0 refills | Status: DC
Start: 2018-08-01 — End: 2018-08-29

## 2018-08-01 NOTE — Progress Notes (Signed)
Subjective:   Patient ID: Juan Parrish, male    DOB: December 14, 1941,   MRN: [id number]    Brief patient profile:  49   yowm only a light smoker quit 2014 s obvious sequelae with onset sob/wheezing fall 2016 referred to pulmonary clinic 09/18/2016 by Dr  Maury Dus    History of Present Illness  09/18/2016 1st [facility] office visit/ Wert   Chief Complaint  Patient presents with  . PULMONARY CONSULT    Referred by Dr. Maury Dus. Pt c/o wheezing, cough and SOB for the past 10 months. He states he has to clear his throat frequently, and sputum is clear in color. He states that he gets winded with exertion such as push mowing, but is able to perform desired activities without his breathing stopping him.   onset fall 2016 with sensation of pnds much worse at bedtime assoc with daytime sense of wheeze but very little acutal mucus production and really Not limited by breathing from desired activities  Even though admits more sob with exertion  No response to singulair / nasocort/ powder inhaler/ symbicort  On prevacid x years not sure of dose but takes in evening not timed with meals rec Prednisone 10 mg take  4 each am x 2 days,   2 each am x 2 days,  1 each am x 2 days and stop  Prevacid 30 mg    Take  30-60 min before first meal of the day and Pepcid (famotidine)  20 mg one @  bedtime until return to office - this is the best way to tell whether stomach acid is contributing to your problem.   GERD diet  Please see patient coordinator before you leave today  to schedule sinus CT  Please remember to go to the lab and x-ray department downstairs for your tests - we will call you with the results when they are available.    09/29/2016  f/u ov/Wert re: cough since fall on 2016  ? Cough variant asthma vs uacs  Chief Complaint  Patient presents with  . Follow-up    Cough has improved some. No new co's today.   50% improved but still present esp at hs assoc with pnds Not limited by  breathing from desired activities   rec No change in any medications except for adding dulera 100 Take 2 puffs first thing in am and then another 2 puffs about 12 hours later.  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 Acute Visit    Pt c/o wheezing and congestion in his throat "for months" worse the past 6 wks.   symtpoms of cough / wheeze indolent persistnent daily since late  April 2018 rx with loratidine / pepcid 20 mg at hs  Did not rememer / activate any other portion of plan Main complaint is something is stuck in his throat he can't cough up but more day than noct and though has "wheeze" really Not limited by breathing from desired activities   rec With any flare of cough/ wheeze/ phlegm building in throat > symbicort 80  Take 2 puffs first thing in am and then another 2 puffs about  12 hours later.  If not improving > Prednisone 10 mg take  4 each am x 2 days,   2 each am x 2 days,  1 each am x 2 days and stop  If still not improving > prilosec 20 mg x 2 take Take 30-60 min before first meal of the day  For drainage / throat tickle try take CHLORPHENIRAMINE  4 mg - take one every 4 hours as needed       08/01/2018  Acute extended ov/Wert re: re-establish re cough x fall 2016 esp since April 2018  And x summer 2019  Chief Complaint  Patient presents with  . Acute Visit    Increased chest congestion and cough x 2-3 months. He states his cough is prod with clear sputum and is worse at night and early am.  He has noticed SOB with doing yardwork recently when this used to not bother him.   denies ever improving but wife insists does respond tansiently to predninsone but not adherent with chronic meds    Dyspnea:  Ok x when pushing mower [facility] = can walk nl pace, flat grade, can't hurry or go uphills or steps s sob   Cough: min prod assoc with wheeze noct  worse unless uses 3 pillows / prefers either side / not so much a problem on waking  in am    SABA use: symbicort makes it worse   loratidine not helping sense of noct pndns    No obvious day to day or daytime variability or assoc excess/ purulent sputum or mucus plugs or hemoptysis or cp or chest tightness, subjective wheeze or overt sinus or hb symptoms.   Also denies any obvious fluctuation of symptoms with weather or environmental changes or other aggravating or alleviating factors except as outlined above   No unusual exposure hx or h/o childhood pna/ asthma or knowledge of premature birth.  Current Allergies, Complete Past Medical History, Past Surgical History, Family History, and Social History were reviewed in [employer] record.  ROS  The following are not active complaints unless bolded Hoarseness, sore throat, dysphagia, dental problems, itching, sneezing,  nasal congestion or discharge of excess mucus or purulent secretions, ear ache,   fever, chills, sweats, unintended wt loss or wt gain, classically pleuritic or exertional cp,  orthopnea pnd or arm/hand swelling  or leg swelling, presyncope, palpitations, abdominal pain, anorexia, nausea, vomiting, diarrhea  or change in bowel habits or change in bladder habits, change in stools or change in urine, dysuria, hematuria,  rash, arthralgias, visual complaints, headache, numbness, weakness or ataxia or problems with walking or coordination,  change in mood or  memory.        Current Meds  Medication Sig  . famotidine (PEPCID) 20 MG tablet One at bedtime  . finasteride (PROSCAR) 5 MG tablet Take 5 mg by mouth daily.  . pravastatin (PRAVACHOL) 40 MG tablet Take 1 tablet by mouth daily.  . tamsulosin (FLOMAX) 0.4 MG CAPS capsule Take 1 capsule by mouth  daily.  Marland Kitchen triamcinolone (NASACORT) 55 MCG/ACT AERO nasal inhaler Place 2 sprays into the nose daily as needed.  .     . [ ]  Loratadine 10 MG CAPS Take 1 capsule by mouth daily.                      Objective:   Physical Exam    amb wm nad hopeless affect   08/01/2018  171   07/01/17 175 lb (79.4 kg)  09/29/16 170 lb (77.1 kg)  09/18/16 169 lb 9.6 oz (76.9 kg)      Vital signs reviewed - Note on arrival 02 sats  95% on RA     HEENT: nl dentition, turbinates bilaterally, and min cobblestoning s excess pnds on inspection of oropharynx. Nl external ear canals without cough reflex   NECK :  without JVD/Nodes/TM/ nl carotid upstrokes bilaterally   LUNGS: no acc muscle use,  Nl contour chest  Distant exp wheeze with fvc, some upper airway wheezing/ both better with plm s cough on insp or exp   CV:  RRR  no s3 or murmur or increase in P2, and no edema   ABD:  soft and nontender with nl inspiratory excursion in the supine position. No bruits or organomegaly appreciated, bowel sounds nl  MS:  Nl gait/ ext warm without deformities, calf tenderness, cyanosis or clubbing No obvious joint restrictions   SKIN: warm and dry without lesions    NEURO:  alert, approp, nl sensorium with  no motor or cerebellar deficits apparent.      CXR PA and Lateral:   08/01/2018 :    I personally reviewed images and  with radiology impression as follows:    Patchy opacity of the lateral left lung base in part due to atelectasis but superimposed early pneumonia is not excluded. My impression:  No significant atx or as dz in the LLL     Assessment & Plan:

## 2018-08-01 NOTE — Progress Notes (Signed)
Spoke with pt and notified of results per Dr. Wert. Pt verbalized understanding and denied any questions. 

## 2018-08-01 NOTE — Patient Instructions (Addendum)
For drainage / throat tickle try take CHLORPHENIRAMINE  4 mg - take one every 4 hours as needed - available over the counter- may cause drowsiness so start with just a bedtime dose or two and see how you tolerate it before trying in daytime    Stop loratadine. And take your bedtime medications one hour before bedtime   Take Prednisone  4 for three days 3 for three days 2 for three days 1 for three days and stop   Try prilosec otc 20mg   Take 30-60 min before first meal of the day and Pepcid ac (famotidine) 20 mg one @  bedtime until return   GERD (REFLUX)  is an extremely common cause of respiratory symptoms just like yours , many times with no obvious heartburn at all.    It can be treated with medication, but also with lifestyle changes including elevation of the head of your bed (ideally with 6 inch  bed blocks),  Smoking cessation, avoidance of late meals, excessive alcohol, and avoid fatty foods, chocolate, peppermint, colas, red wine, and acidic juices such as orange juice.  NO MINT OR MENTHOL PRODUCTS SO NO COUGH DROPS   USE SUGARLESS CANDY INSTEAD (Jolley ranchers or Stover's or Life Savers) or even ice chips will also do - the key is to swallow to prevent all throat clearing. NO OIL BASED VITAMINS - use powdered substitutes.     Please remember to go to the  x-ray department downstairs in the basement  for your tests - we will call you with the results when they are available.      Please schedule a follow up office visit in 4 weeks, sooner if needed  with all medications /inhalers/ solutions in hand so we can verify exactly what you are taking. This includes all medications from all doctors and over the counters

## 2018-08-02 ENCOUNTER — Encounter: Payer: Self-pay | Admitting: Internal Medicine

## 2018-08-02 NOTE — Assessment & Plan Note (Signed)
Spirometry 09/18/2016  wnl including fef 25-75 while actively "wheezing" -  FENO 09/18/2016  =   145 - Allergy profile 09/18/16  >  Eos 0.2 /  IgE  112 neg RAST  - Sinus CT 09/22/16 > Mild opacification and a superior anterior left ethmoid air cell. Paranasal sinuses elsewhere clear on this limited study. No air-fluid level  - 09/29/2016    try dulera 100 2bid > stopped s flare until 03/2017  - 07/01/2017  After extensive coaching HFA effectiveness =  75% - FENO 08/01/2018  =   194 - Spirometry 08/01/2018  FEV1 2.17 (77%)  Ratio 75 with min curvature      Clearly has element of asthma but also bothered by pnds and no evidence that he followed original recs to eliminate this so will start with focusing on the noct cough by adding h1 and h2 at hs and avoiding ics which he says just aggravate the cough in favor of pred x 12 day course to sort out whether he really responds to prednisone or not.   Regardless of above, explained to pt and wife:  Unlike when you get a prescription for eyeglasses, it's not possible to always walk out of this or any medical office with a perfect prescription that is immediately effective  based on any test that we offer here.    On the contrary, it may take several weeks for the full impact of changes recommended today - hopefully you will respond well.  If not, then we'll adjust your medication on your next visit accordingly, knowing more then than we can possibly know now.       I had an extended discussion with the patient reviewing all relevant studies completed to date and  lasting 25 minutes of a 40  minute acute office  visit addressing   severe non-specific but potentially very serious refractory respiratory symptoms of uncertain and potentially multiple  etiologies.   Each maintenance medication was reviewed in detail including most importantly the difference between maintenance and prns and under what circumstances the prns are to be triggered using an action plan  format that is not reflected in the computer generated alphabetically organized AVS.    Please see AVS for specific instructions unique to this office visit that I personally wrote and verbalized to the the pt in detail and then reviewed with pt  by my nurse highlighting any changes in therapy/plan of care  recommended at today's visit.

## 2018-08-15 DIAGNOSIS — H16142 Punctate keratitis, left eye: Secondary | ICD-10-CM | POA: Diagnosis not present

## 2018-08-15 DIAGNOSIS — H1820 Unspecified corneal edema: Secondary | ICD-10-CM | POA: Diagnosis not present

## 2018-08-17 DIAGNOSIS — H43812 Vitreous degeneration, left eye: Secondary | ICD-10-CM | POA: Diagnosis not present

## 2018-08-17 DIAGNOSIS — H16142 Punctate keratitis, left eye: Secondary | ICD-10-CM | POA: Diagnosis not present

## 2018-08-17 DIAGNOSIS — H1820 Unspecified corneal edema: Secondary | ICD-10-CM | POA: Diagnosis not present

## 2018-08-17 DIAGNOSIS — H2702 Aphakia, left eye: Secondary | ICD-10-CM | POA: Diagnosis not present

## 2018-08-23 DIAGNOSIS — H1820 Unspecified corneal edema: Secondary | ICD-10-CM | POA: Diagnosis not present

## 2018-08-23 DIAGNOSIS — H16142 Punctate keratitis, left eye: Secondary | ICD-10-CM | POA: Diagnosis not present

## 2018-08-29 ENCOUNTER — Ambulatory Visit: Payer: PPO | Admitting: Internal Medicine

## 2018-08-29 ENCOUNTER — Encounter: Payer: Self-pay | Admitting: Internal Medicine

## 2018-08-29 VITALS — BP 120/70 | HR 56 | Ht 66.25 in | Wt 168.0 lb

## 2018-08-29 DIAGNOSIS — Z23 Encounter for immunization: Secondary | ICD-10-CM | POA: Diagnosis not present

## 2018-08-29 DIAGNOSIS — J45991 Cough variant asthma: Secondary | ICD-10-CM | POA: Diagnosis not present

## 2018-08-29 MED ORDER — MOMETASONE FURO-FORMOTEROL FUM 100-5 MCG/ACT IN AERO: 2.0000 | INHALATION_SPRAY | Freq: Two times a day (BID) | RESPIRATORY_TRACT | 11 refills | Status: DC

## 2018-08-29 MED ORDER — MOMETASONE FURO-FORMOTEROL FUM 100-5 MCG/ACT IN AERO: 2.0000 | INHALATION_SPRAY | Freq: Two times a day (BID) | RESPIRATORY_TRACT | 0 refills | Status: DC

## 2018-08-29 NOTE — Patient Instructions (Addendum)
No change in medications if you are satisfied but remember that For drainage / throat tickle try take CHLORPHENIRAMINE  4 mg - take one every 4 hours as needed - available over the counter- may cause drowsiness so start with just a bedtime dose or two and see how you tolerate it before trying in daytime     If not satisfied add dulera 100 Take 2 puffs first thing in am and then another 2 puffs about 12 hours later.     If still not satisfied call for allergy referral next

## 2018-08-29 NOTE — Progress Notes (Signed)
Subjective:   Patient ID: Juan Parrish, male    DOB: 26-Aug-1941,   MRN: [id number]    Brief patient profile:  73   yowm only a light smoker quit 2014 s obvious sequelae with onset sob/wheezing fall 2016 referred to pulmonary clinic 09/18/2016 by Dr  Maury Dus    History of Present Illness  09/18/2016 1st [place] office visit/ Laylamarie Meuser   Chief Complaint  Patient presents with  . PULMONARY CONSULT    Referred by Dr. Maury Dus. Pt c/o wheezing, cough and SOB for the past 10 months. He states he has to clear his throat frequently, and sputum is clear in color. He states that he gets winded with exertion such as push mowing, but is able to perform desired activities without his breathing stopping him.   onset fall 2016 with sensation of pnds much worse at bedtime assoc with daytime sense of wheeze but very little acutal mucus production and really Not limited by breathing from desired activities  Even though admits more sob with exertion  No response to singulair / nasocort/ powder inhaler/ symbicort  On prevacid x years not sure of dose but takes in evening not timed with meals rec Prednisone 10 mg take  4 each am x 2 days,   2 each am x 2 days,  1 each am x 2 days and stop  Prevacid 30 mg    Take  30-60 min before first meal of the day and Pepcid (famotidine)  20 mg one @  bedtime until return to office - this is the best way to tell whether stomach acid is contributing to your problem.   GERD diet  Please see patient coordinator before you leave today  to schedule sinus CT  Please remember to go to the lab and x-ray department downstairs for your tests - we will call you with the results when they are available.    09/29/2016  f/u ov/Ival Basquez re: cough since fall on 2016  ? Cough variant asthma vs uacs  Chief Complaint  Patient presents with  . Follow-up    Cough has improved some. No new co's today.   50% improved but still present esp at hs assoc with pnds Not limited by  breathing from desired activities   rec No change in any medications except for adding dulera 100 Take 2 puffs first thing in am and then another 2 puffs about 12 hours later.  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 Acute Visit    Pt c/o wheezing and congestion in his throat "for months" worse the past 6 wks.   symtpoms of cough / wheeze indolent persistnent daily since late  April 2018 rx with loratidine / pepcid 20 mg at hs  Did not rememer / activate any other portion of plan Main complaint is something is stuck in his throat he can't cough up but more day than noct and though has "wheeze" really Not limited by breathing from desired activities   rec With any flare of cough/ wheeze/ phlegm building in throat > symbicort 80  Take 2 puffs first thing in am and then another 2 puffs about  12 hours later.  If not improving > Prednisone 10 mg take  4 each am x 2 days,   2 each am x 2 days,  1 each am x 2 days and stop  If still not improving > prilosec 20 mg x 2 take Take 30-60 min before first meal of the day  For drainage / throat tickle try take CHLORPHENIRAMINE  4 mg - take one every 4 hours as needed       08/01/2018  Acute extended ov/Grizelda Piscopo re: re-establish re cough x fall 2016 esp since April 2018  And x summer 2019  Chief Complaint  Patient presents with  . Acute Visit    Increased chest congestion and cough x 2-3 months. He states his cough is prod with clear sputum and is worse at night and early am.  He has noticed SOB with doing yardwork recently when this used to not bother him.   denies ever improving but wife insists does respond tansiently to predninsone but not adherent with chronic meds    Dyspnea:  Ok x when pushing mower [facility] = can walk nl pace, flat grade, can't hurry or go uphills or steps s sob   Cough: min prod assoc with wheeze noct  worse unless uses 3 pillows / prefers either side / not so much a problem on waking  in am   SABA use: symbicort makes it worse  loratidine not helping sense of noct pnds rec For drainage / throat tickle try take CHLORPHENIRAMINE  4 mg - take one every 4 hours as needed - available over the counter- may cause drowsiness so start with just a bedtime dose or two and see how you tolerate it before trying in daytime   Stop loratadine. And take your bedtime medications one hour before bedtime  Take Prednisone  4 for three days 3 for three days 2 for three days 1 for three days and stop  Try prilosec otc 20mg   Take 30-60 min before first meal of the day and Pepcid ac (famotidine) 20 mg one @  bedtime until return  GERD diet Please remember to go to the  x-ray department downstairs in the basement  for your tests - we will call you with the results when they are available. Please schedule a follow up office visit in 4 weeks, sooner if needed  with all medications /inhalers/ solutions in hand so we can verify exactly what you are taking. This includes all medications from all doctors and over the counters    08/29/2018  f/u ov/Britney Captain re: cough  X fall 2016 improved c/w uacs/pnds vs cough variant asthma  Chief Complaint  Patient presents with  . Follow-up    Chest congestion is improved some. No new co's.   prednisone made it much better while on it and " it if it stays the way it is  I'm tickled pink"  Sleeping fine chlorpheniramine 4 mg x one/ not using during the day Not limited by breathing from desired activities     No obvious day to day or daytime variability or assoc excess/ purulent sputum or mucus plugs or hemoptysis or cp or chest tightness, subjective wheeze or overt sinus or hb symptoms.   Sleeping fine flat  without nocturnal  or  early am exacerbation  of respiratory  c/o's or need for noct saba. Also denies any obvious fluctuation of symptoms with weather or environmental changes or other aggravating or alleviating factors except as outlined above  No unusual exposure hx or h/o childhood pna/ asthma or knowledge of premature birth.  Current Allergies, Complete Past Medical History, Past Surgical History, Family History, and Social History were reviewed in [employer] record.  ROS  The following are not active complaints unless bolded Hoarseness, sore throat, dysphagia, dental problems, itching, sneezing,  nasal congestion or discharge of excess mucus or purulent secretions, ear ache,   fever, chills, sweats, unintended wt loss or wt gain, classically pleuritic or exertional cp,  orthopnea pnd or arm/hand swelling  or leg swelling, presyncope, palpitations, abdominal pain, anorexia, nausea, vomiting, diarrhea  or change in bowel habits or change in bladder habits, change in stools or change in urine, dysuria, hematuria,  rash, arthralgias, visual complaints, headache, numbness, weakness or ataxia or problems with walking or coordination,  change in mood or  memory.        Current Meds  Medication Sig  . chlorpheniramine (CHLOR-TRIMETON) 4 MG tablet Take 4 mg by mouth every 4 (four) hours as needed for allergies.  . famotidine (PEPCID) 20 MG tablet One at bedtime  . finasteride (PROSCAR) 5 MG tablet Take 5 mg by mouth daily.  Marland Kitchen omeprazole (PRILOSEC OTC) 20 MG tablet Take 1 tablet (20 mg total) by mouth daily.  . pravastatin (PRAVACHOL) 40 MG tablet Take 1 tablet by mouth daily.  . tamsulosin (FLOMAX) 0.4 MG CAPS capsule Take 1 capsule by mouth daily.  Marland Kitchen triamcinolone (NASACORT) 55 MCG/ACT AERO nasal inhaler Place 2 sprays into the nose daily as needed.                       Objective:   Physical Exam   amb wm somber affect   08/29/2018          168  08/01/2018        171   07/01/17  175 lb (79.4 kg)  09/29/16 170 lb (77.1 kg)  09/18/16 169 lb 9.6 oz (76.9 kg)      Vital signs reviewed - Note on arrival 02 sats  97% on RA    HEENT: nl dentition, turbinates bilaterally, and oropharynx. Nl external ear canals without cough reflex   NECK :  without JVD/Nodes/TM/ nl carotid upstrokes bilaterally   LUNGS: no acc muscle use,  Nl contour chest which is clear to A and P bilaterally without cough on insp or exp maneuvers   CV:  RRR  no s3 or murmur or increase in P2, and no edema   ABD:  soft and nontender with nl inspiratory excursion in the supine position. No bruits or organomegaly appreciated, bowel sounds nl  MS:  Nl gait/ ext warm without deformities, calf tenderness, cyanosis or clubbing No obvious joint restrictions   SKIN: warm and dry without lesions    NEURO:  alert, approp, nl sensorium with  no motor or cerebellar deficits apparent.           Assessment & Plan:

## 2018-08-30 ENCOUNTER — Encounter: Payer: Self-pay | Admitting: Internal Medicine

## 2018-08-30 NOTE — Assessment & Plan Note (Signed)
Spirometry 09/18/2016  wnl including fef 25-75 while actively "wheezing" -  FENO 09/18/2016  =   145 - Allergy profile 09/18/16  >  Eos 0.2 /  IgE  112 neg RAST  - Sinus CT 09/22/16 > Mild opacification and a superior anterior left ethmoid air cell. Paranasal sinuses elsewhere clear on this limited study. No air-fluid level  - 09/29/2016    try dulera 100 2bid > stopped s flare until 03/2017  - 07/01/2017  After extensive coaching HFA effectiveness =  75% - FENO 08/01/2018  =   194 - Spirometry 08/01/2018  FEV1 2.17 (77%)  Ratio 75 with min curvature     - prednisone x 12 day course 08/01/18 > elimination of cough to pt's satisfaction   - 08/29/2018  After extensive coaching inhaler device,  effectiveness =    75% > keep dulera 100 on hand if cough relapses off pred  He has been very neg on maint rx for asthma in past but convinced prednisone helps despite neg allergy profile so rec continue max rx for gerd and 1st gen H1 blockers per guidelines  And if cough worsens start back immediately on dulera 100 up to 2 every 12 hours if needed to control it Based on two studies from NEJM  378; 20 p 1865 (2018) and 380 : p2020-30 (2019) in pts with mild asthma it is reasonable to use low dose symbicort eg 80 2bid "prn" flare in this setting but I emphasized this was only shown with symbicort (and by extrapolation should also work with  [employer]) and takes advantage of the rapid onset of action but is not the same as "rescue therapy" but can be stopped once the acute symptoms have resolved and the need for rescue has been minimized (< 2 x weekly)     If not satisfied next step is allergy referral   I had an extended discussion with the patient and wife  reviewing all relevant studies completed to date and  lasting 15 to 20 minutes of a 25 minute summary visit    See device teaching which extended face to face time for this visit.  Each maintenance medication was reviewed in detail including emphasizing most  importantly the difference between maintenance and prns and under what circumstances the prns are to be triggered using an action plan format that is not reflected in the computer generated alphabetically organized AVS which I have not found useful in most complex patients, especially with respiratory illnesses  Please see AVS for specific instructions unique to this visit that I personally wrote and verbalized to the the pt in detail and then reviewed with pt  by my nurse highlighting any  changes in therapy recommended at today's visit to their plan of care.

## 2019-02-01 ENCOUNTER — Ambulatory Visit: Payer: Self-pay | Admitting: General Surgery

## 2019-03-06 ENCOUNTER — Other Ambulatory Visit: Payer: Self-pay

## 2019-03-06 ENCOUNTER — Encounter (HOSPITAL_BASED_OUTPATIENT_CLINIC_OR_DEPARTMENT_OTHER): Payer: Self-pay | Admitting: *Deleted

## 2019-03-06 NOTE — Progress Notes (Signed)
Spoke with patient via telephone for pre op interview. No medications AM of surgery. NPO after MN. Arrival time 0530.

## 2019-03-09 ENCOUNTER — Ambulatory Visit (HOSPITAL_BASED_OUTPATIENT_CLINIC_OR_DEPARTMENT_OTHER): Admission: RE | Admit: 2019-03-09 | Payer: Medicare Other | Source: Home / Self Care | Admitting: General Surgery

## 2019-03-09 HISTORY — DX: Presence of artificial eye: Z97.0

## 2019-03-09 SURGERY — HEMORRHOIDECTOMY
Anesthesia: Choice

## 2019-04-28 ENCOUNTER — Ambulatory Visit: Payer: Self-pay | Admitting: General Surgery

## 2019-07-28 ENCOUNTER — Ambulatory Visit: Payer: Self-pay | Admitting: General Surgery

## 2019-09-13 ENCOUNTER — Ambulatory Visit: Payer: Self-pay | Admitting: General Surgery

## 2019-10-06 NOTE — Patient Instructions (Addendum)
DUE TO COVID-19 ONLY ONE VISITOR IS ALLOWED TO COME WITH YOU AND STAY IN THE WAITING ROOM ONLY DURING PRE OP AND PROCEDURE DAY OF SURGERY. THE 1 VISITOR MAY VISIT WITH YOU AFTER SURGERY IN YOUR PRIVATE ROOM DURING VISITING HOURS ONLY!  YOU NEED TO HAVE A COVID 19 TEST ON__Monday 10/19/2020_____ @__315  pm_____, THIS TEST MUST BE DONE BEFORE SURGERY, COME  [address], [place] , [id number].  ([place]) ONCE YOUR COVID TEST IS COMPLETED, PLEASE BEGIN THE QUARANTINE INSTRUCTIONS AS OUTLINED IN YOUR HANDOUT.                Juan Parrish    Your procedure is scheduled on: Thursday 10/12/2019   Report to [facility] Main  Entrance    Report to Short Stay at  [place]  AM     Call this number if you have problems the morning of surgery [phone]    Remember: Do not eat food  :After Midnight.    NO SOLID FOOD AFTER MIDNIGHT THE NIGHT PRIOR TO SURGERY. NOTHING BY MOUTH EXCEPT CLEAR LIQUIDS UNTIL 0430 am .    PLEASE FINISH ENSURE DRINK PER SURGEON ORDER  WHICH NEEDS TO BE COMPLETED AT 0430 am .   CLEAR LIQUID DIET   Foods Allowed                                                                     Foods Excluded  Coffee and tea, regular and decaf                             liquids that you cannot  Plain Jell-O any favor except red or purple                                           see through such as: Fruit ices (not with fruit pulp)                                     milk, soups, orange juice  Iced Popsicles                                    All solid food Carbonated beverages, regular and diet                                    Cranberry, grape and apple juices Sports drinks like Gatorade Lightly seasoned clear broth or consume(fat free) Sugar, honey syrup  Sample Menu Breakfast                                Lunch  Supper Cranberry juice                    Beef broth                            Chicken  broth Jell-O                                     Grape juice                           Apple juice Coffee or tea                        Jell-O                                      Popsicle                                                Coffee or tea                        Coffee or tea  _____________________________________________________________________     BRUSH YOUR TEETH MORNING OF SURGERY AND RINSE YOUR MOUTH OUT, NO CHEWING GUM CANDY OR MINTS.     Take these medicines the morning of surgery with A SIP OF WATER: none                                 You may not have any metal on your body including hair pins and              piercings  Do not wear jewelry, make-up, lotions, powders or perfumes, deodorant                          Men may shave face and neck.   Do not bring valuables to the hospital. [place].  Contacts, dentures or bridgework may not be worn into surgery.  Leave suitcase in the car. After surgery it may be brought to your room.     Patients discharged the day of surgery will not be allowed to drive home. IF YOU ARE HAVING SURGERY AND GOING HOME THE SAME DAY, YOU MUST HAVE AN ADULT TO DRIVE YOU HOME AND BE WITH YOU FOR 24 HOURS. YOU MAY GO HOME BY TAXI OR UBER OR ORTHERWISE, BUT AN ADULT MUST ACCOMPANY YOU HOME AND STAY WITH YOU FOR 24 HOURS.  Name and phone number of your driver: spouse- Hassan Rowan [phone]              Please read over the following fact sheets you were given: _____________________________________________________________________             [facility] - Preparing for Surgery Before surgery, you can play an important role.  Because skin is not sterile, your skin needs to be as  free of germs as possible.  You can reduce the number of germs on your skin by washing with CHG (chlorahexidine gluconate) soap before surgery.  CHG is an antiseptic cleaner which kills germs and bonds with the skin to  continue killing germs even after washing. Please DO NOT use if you have an allergy to CHG or antibacterial soaps.  If your skin becomes reddened/irritated stop using the CHG and inform your nurse when you arrive at Short Stay. Do not shave (including legs and underarms) for at least 48 hours prior to the first CHG shower.  You may shave your face/neck. Please follow these instructions carefully:  1.  Shower with CHG Soap the night before surgery and the  morning of Surgery.  2.  If you choose to wash your hair, wash your hair first as usual with your  normal  shampoo.  3.  After you shampoo, rinse your hair and body thoroughly to remove the  shampoo.                           4.  Use CHG as you would any other liquid soap.  You can apply chg directly  to the skin and wash                       Gently with a scrungie or clean washcloth.  5.  Apply the CHG Soap to your body ONLY FROM THE NECK DOWN.   Do not use on face/ open                           Wound or open sores. Avoid contact with eyes, ears mouth and genitals (private parts).                       Wash face,  Genitals (private parts) with your normal soap.             6.  Wash thoroughly, paying special attention to the area where your surgery  will be performed.  7.  Thoroughly rinse your body with warm water from the neck down.  8.  DO NOT shower/wash with your normal soap after using and rinsing off  the CHG Soap.                9.  Pat yourself dry with a clean towel.            10.  Wear clean pajamas.            11.  Place clean sheets on your bed the night of your first shower and do not  sleep with pets. Day of Surgery : Do not apply any lotions/deodorants the morning of surgery.  Please wear clean clothes to the hospital/surgery center.  FAILURE TO FOLLOW THESE INSTRUCTIONS MAY RESULT IN THE CANCELLATION OF YOUR SURGERY PATIENT SIGNATURE_________________________________  NURSE  SIGNATURE__________________________________  ________________________________________________________________________

## 2019-10-09 ENCOUNTER — Encounter (HOSPITAL_COMMUNITY): Payer: Self-pay

## 2019-10-09 ENCOUNTER — Encounter (HOSPITAL_COMMUNITY)
Admission: RE | Admit: 2019-10-09 | Discharge: 2019-10-09 | Disposition: A | Discharge status: Home / Self Care | Payer: Medicare Other | Source: Ambulatory Visit | Attending: General Surgery | Admitting: General Surgery

## 2019-10-09 ENCOUNTER — Other Ambulatory Visit (HOSPITAL_COMMUNITY)
Admission: RE | Admit: 2019-10-09 | Discharge: 2019-10-09 | Disposition: A | Discharge status: Home / Self Care | Payer: Medicare Other | Source: Ambulatory Visit | Attending: General Surgery | Admitting: General Surgery

## 2019-10-09 ENCOUNTER — Other Ambulatory Visit: Payer: Self-pay

## 2019-10-09 DIAGNOSIS — Z20828 Contact with and (suspected) exposure to other viral communicable diseases: Secondary | ICD-10-CM | POA: Diagnosis not present

## 2019-10-09 DIAGNOSIS — I10 Essential (primary) hypertension: Secondary | ICD-10-CM | POA: Diagnosis not present

## 2019-10-09 DIAGNOSIS — R9431 Abnormal electrocardiogram [ECG] [EKG]: Secondary | ICD-10-CM | POA: Diagnosis not present

## 2019-10-09 DIAGNOSIS — I4510 Unspecified right bundle-branch block: Secondary | ICD-10-CM | POA: Diagnosis not present

## 2019-10-09 DIAGNOSIS — Z01818 Encounter for other preprocedural examination: Secondary | ICD-10-CM | POA: Insufficient documentation

## 2019-10-09 DIAGNOSIS — K648 Other hemorrhoids: Secondary | ICD-10-CM | POA: Diagnosis not present

## 2019-10-09 HISTORY — DX: Essential (primary) hypertension: I10

## 2019-10-09 LAB — BASIC METABOLIC PANEL
Anion gap: 9 (ref 5–15)
BUN: 11 mg/dL (ref 8–23)
CO2: 26 mmol/L (ref 22–32)
Calcium: 9.3 mg/dL (ref 8.9–10.3)
Chloride: 105 mmol/L (ref 98–111)
Creatinine, Ser: 0.93 mg/dL (ref 0.61–1.24)
GFR calc Af Amer: >60 mL/min (ref >60)
GFR calc non Af Amer: >60 mL/min (ref >60)
Glucose, Bld: 136 mg/dL — ABNORMAL HIGH (ref 70–99)
Potassium: 4.6 mmol/L (ref 3.5–5.1)
Sodium: 140 mmol/L (ref 135–145)

## 2019-10-09 LAB — CBC
HCT: 44.2 % (ref 39.0–52.0)
Hemoglobin: 14.2 g/dL (ref 13.0–17.0)
MCH: 29.8 pg (ref 26.0–34.0)
MCHC: 32.1 g/dL (ref 30.0–36.0)
MCV: 92.7 fL (ref 80.0–100.0)
Platelets: 194 10*3/uL (ref 150–400)
RBC: 4.77 MIL/uL (ref 4.22–5.81)
RDW: 13.2 % (ref 11.5–15.5)
WBC: 6.6 10*3/uL (ref 4.0–10.5)
nRBC: 0 % (ref 0.0–0.2)

## 2019-10-09 MED ORDER — ENSURE PRE-SURGERY PO LIQD
296.0000 mL | Freq: Once | ORAL | Status: DC
  Filled 2019-10-09: qty 296

## 2019-10-09 NOTE — Progress Notes (Signed)
PCP - Dr. Maury Dus Cardiologist - None  Chest x-ray - None  08/01/2018- saw Dr. Melvyn Novas for a cough and Shortness of breath for the past 10 months  EKG -10/09/2019  Stress Test - None ECHO -None  Cardiac Cath -None   Sleep Study - None CPAP -   Fasting Blood Sugar - none Checks Blood Sugar _____ times a day  Blood Thinner Instructions:none Aspirin Instructions:none Last Dose: Had been on Aspirin and stopped it 1-2 years ago  Anesthesia review:   Patient has history of HTN, Testicular cancer  Patient denies shortness of breath, fever, cough and chest pain at PAT appointment   Patient verbalized understanding of instructions that were given to them at the PAT appointment. Patient was also instructed that they will need to review over the PAT instructions again at home before surgery.

## 2019-10-10 LAB — NOVEL CORONAVIRUS, NAA (HOSP ORDER, SEND-OUT TO REF LAB; TAT 18-24 HRS): SARS-CoV-2, NAA: NOT DETECTED

## 2019-10-11 MED ORDER — BUPIVACAINE LIPOSOME 1.3 % IJ SUSP
20.0000 mL | Freq: Once | INTRAMUSCULAR | Status: DC
  Filled 2019-10-11: qty 20

## 2019-10-12 ENCOUNTER — Ambulatory Visit (HOSPITAL_COMMUNITY): Payer: Medicare Other | Admitting: Certified Registered"

## 2019-10-12 ENCOUNTER — Other Ambulatory Visit: Payer: Self-pay

## 2019-10-12 ENCOUNTER — Encounter (HOSPITAL_COMMUNITY): Payer: Self-pay

## 2019-10-12 ENCOUNTER — Encounter (HOSPITAL_COMMUNITY)
Admission: RE | Disposition: A | Discharge status: Home / Self Care | Payer: Self-pay | Source: Home / Self Care | Attending: General Surgery

## 2019-10-12 ENCOUNTER — Ambulatory Visit (HOSPITAL_COMMUNITY)
Admission: RE | Admit: 2019-10-12 | Discharge: 2019-10-12 | Disposition: A | Discharge status: Home / Self Care | Payer: Medicare Other | Attending: General Surgery | Admitting: General Surgery

## 2019-10-12 ENCOUNTER — Ambulatory Visit (HOSPITAL_COMMUNITY): Payer: Medicare Other | Admitting: Physician Assistant

## 2019-10-12 DIAGNOSIS — K644 Residual hemorrhoidal skin tags: Secondary | ICD-10-CM | POA: Diagnosis present

## 2019-10-12 DIAGNOSIS — Z9079 Acquired absence of other genital organ(s): Secondary | ICD-10-CM | POA: Diagnosis not present

## 2019-10-12 DIAGNOSIS — I10 Essential (primary) hypertension: Secondary | ICD-10-CM | POA: Diagnosis not present

## 2019-10-12 DIAGNOSIS — Z8547 Personal history of malignant neoplasm of testis: Secondary | ICD-10-CM | POA: Diagnosis not present

## 2019-10-12 DIAGNOSIS — Z87891 Personal history of nicotine dependence: Secondary | ICD-10-CM | POA: Insufficient documentation

## 2019-10-12 DIAGNOSIS — Z8249 Family history of ischemic heart disease and other diseases of the circulatory system: Secondary | ICD-10-CM | POA: Insufficient documentation

## 2019-10-12 DIAGNOSIS — Z79899 Other long term (current) drug therapy: Secondary | ICD-10-CM | POA: Diagnosis not present

## 2019-10-12 HISTORY — PX: HEMORRHOID SURGERY: SHX153

## 2019-10-12 SURGERY — HEMORRHOIDECTOMY
Anesthesia: General | Site: Rectum

## 2019-10-12 MED ORDER — ROCURONIUM BROMIDE 10 MG/ML (PF) SYRINGE
PREFILLED_SYRINGE | INTRAVENOUS | Status: DC | PRN
  Administered 2019-10-12: 30 mg via INTRAVENOUS

## 2019-10-12 MED ORDER — BUPIVACAINE LIPOSOME 1.3 % IJ SUSP
INTRAMUSCULAR | Status: DC | PRN
  Administered 2019-10-12: 20 mL

## 2019-10-12 MED ORDER — CEFAZOLIN SODIUM-DEXTROSE 2-4 GM/100ML-% IV SOLN
2.0000 g | INTRAVENOUS | Status: AC
  Administered 2019-10-12: 2 g via INTRAVENOUS
  Filled 2019-10-12: qty 100

## 2019-10-12 MED ORDER — LIDOCAINE 2% (20 MG/ML) 5 ML SYRINGE
INTRAMUSCULAR | Status: AC
  Filled 2019-10-12: qty 5

## 2019-10-12 MED ORDER — DEXAMETHASONE SODIUM PHOSPHATE 10 MG/ML IJ SOLN
INTRAMUSCULAR | Status: AC
  Filled 2019-10-12: qty 1

## 2019-10-12 MED ORDER — LACTATED RINGERS IV SOLN
INTRAVENOUS | Status: DC
  Administered 2019-10-12: 06:00:00 via INTRAVENOUS

## 2019-10-12 MED ORDER — FENTANYL CITRATE (PF) 100 MCG/2ML IJ SOLN
INTRAMUSCULAR | Status: DC | PRN
  Administered 2019-10-12: 50 ug via INTRAVENOUS

## 2019-10-12 MED ORDER — CHLORHEXIDINE GLUCONATE CLOTH 2 % EX PADS: 6.0000 | MEDICATED_PAD | Freq: Once | CUTANEOUS | Status: DC

## 2019-10-12 MED ORDER — DEXAMETHASONE SODIUM PHOSPHATE 10 MG/ML IJ SOLN
INTRAMUSCULAR | Status: DC | PRN
  Administered 2019-10-12: 4 mg via INTRAVENOUS

## 2019-10-12 MED ORDER — OXYCODONE HCL 5 MG/5ML PO SOLN: 5.0000 mg | Freq: Once | ORAL | Status: DC | PRN

## 2019-10-12 MED ORDER — SUCCINYLCHOLINE CHLORIDE 200 MG/10ML IV SOSY
PREFILLED_SYRINGE | INTRAVENOUS | Status: DC | PRN
  Administered 2019-10-12: 100 mg via INTRAVENOUS

## 2019-10-12 MED ORDER — SUGAMMADEX SODIUM 200 MG/2ML IV SOLN
INTRAVENOUS | Status: DC | PRN
  Administered 2019-10-12: 160 mg via INTRAVENOUS

## 2019-10-12 MED ORDER — LIDOCAINE HCL URETHRAL/MUCOSAL 2 % EX GEL
CUTANEOUS | Status: AC
  Filled 2019-10-12: qty 5

## 2019-10-12 MED ORDER — SUCCINYLCHOLINE CHLORIDE 200 MG/10ML IV SOSY
PREFILLED_SYRINGE | INTRAVENOUS | Status: AC
  Filled 2019-10-12: qty 10

## 2019-10-12 MED ORDER — CELECOXIB 200 MG PO CAPS
200.0000 mg | ORAL_CAPSULE | ORAL | Status: AC
  Administered 2019-10-12: 06:00:00 200 mg via ORAL
  Filled 2019-10-12: qty 1

## 2019-10-12 MED ORDER — PROPOFOL 10 MG/ML IV BOLUS
INTRAVENOUS | Status: AC
  Filled 2019-10-12: qty 20

## 2019-10-12 MED ORDER — EPHEDRINE 5 MG/ML INJ
INTRAVENOUS | Status: AC
  Filled 2019-10-12: qty 10

## 2019-10-12 MED ORDER — ONDANSETRON HCL 4 MG/2ML IJ SOLN
INTRAMUSCULAR | Status: AC
  Filled 2019-10-12: qty 2

## 2019-10-12 MED ORDER — EPHEDRINE SULFATE-NACL 50-0.9 MG/10ML-% IV SOSY
PREFILLED_SYRINGE | INTRAVENOUS | Status: DC | PRN
  Administered 2019-10-12 (×2): 10 mg via INTRAVENOUS

## 2019-10-12 MED ORDER — OXYCODONE HCL 5 MG PO TABS: 5.0000 mg | ORAL_TABLET | Freq: Four times a day (QID) | ORAL | 0 refills | Status: DC | PRN

## 2019-10-12 MED ORDER — IBUPROFEN 800 MG PO TABS: 800.0000 mg | ORAL_TABLET | Freq: Three times a day (TID) | ORAL | 0 refills | Status: AC | PRN

## 2019-10-12 MED ORDER — FENTANYL CITRATE (PF) 100 MCG/2ML IJ SOLN: 25.0000 ug | INTRAMUSCULAR | Status: DC | PRN

## 2019-10-12 MED ORDER — ACETAMINOPHEN 160 MG/5ML PO SOLN: 1000.0000 mg | Freq: Once | ORAL | Status: DC | PRN

## 2019-10-12 MED ORDER — LIDOCAINE 2% (20 MG/ML) 5 ML SYRINGE
INTRAMUSCULAR | Status: DC | PRN
  Administered 2019-10-12: 60 mg via INTRAVENOUS

## 2019-10-12 MED ORDER — DIBUCAINE (PERIANAL) 1 % EX OINT
TOPICAL_OINTMENT | CUTANEOUS | Status: AC
  Filled 2019-10-12: qty 28

## 2019-10-12 MED ORDER — OXYCODONE HCL 5 MG PO TABS: 5.0000 mg | ORAL_TABLET | Freq: Four times a day (QID) | ORAL | 0 refills | Status: AC | PRN

## 2019-10-12 MED ORDER — BUPIVACAINE HCL (PF) 0.25 % IJ SOLN
INTRAMUSCULAR | Status: AC
  Filled 2019-10-12: qty 30

## 2019-10-12 MED ORDER — IBUPROFEN 800 MG PO TABS: 800.0000 mg | ORAL_TABLET | Freq: Three times a day (TID) | ORAL | 0 refills | Status: DC | PRN

## 2019-10-12 MED ORDER — ACETAMINOPHEN 500 MG PO TABS
1000.0000 mg | ORAL_TABLET | ORAL | Status: AC
  Administered 2019-10-12: 1000 mg via ORAL
  Filled 2019-10-12: qty 2

## 2019-10-12 MED ORDER — ACETAMINOPHEN 500 MG PO TABS: 1000.0000 mg | ORAL_TABLET | Freq: Once | ORAL | Status: DC | PRN

## 2019-10-12 MED ORDER — BUPIVACAINE HCL (PF) 0.25 % IJ SOLN
INTRAMUSCULAR | Status: DC | PRN
  Administered 2019-10-12: 30 mL

## 2019-10-12 MED ORDER — ONDANSETRON HCL 4 MG/2ML IJ SOLN
INTRAMUSCULAR | Status: DC | PRN
  Administered 2019-10-12: 4 mg via INTRAVENOUS

## 2019-10-12 MED ORDER — FENTANYL CITRATE (PF) 100 MCG/2ML IJ SOLN
INTRAMUSCULAR | Status: AC
  Filled 2019-10-12: qty 2

## 2019-10-12 MED ORDER — ACETAMINOPHEN 10 MG/ML IV SOLN: 1000.0000 mg | Freq: Once | INTRAVENOUS | Status: DC | PRN

## 2019-10-12 MED ORDER — PROPOFOL 10 MG/ML IV BOLUS
INTRAVENOUS | Status: DC | PRN
  Administered 2019-10-12: 100 mg via INTRAVENOUS

## 2019-10-12 MED ORDER — 0.9 % SODIUM CHLORIDE (POUR BTL) OPTIME
TOPICAL | Status: DC | PRN
  Administered 2019-10-12: 1000 mL

## 2019-10-12 MED ORDER — OXYCODONE HCL 5 MG PO TABS: 5.0000 mg | ORAL_TABLET | Freq: Once | ORAL | Status: DC | PRN

## 2019-10-12 SURGICAL SUPPLY — 47 items
BLADE EXTENDED COATED 6.5IN (ELECTRODE) IMPLANT
BLADE HEX COATED 2.75 (ELECTRODE) ×1 IMPLANT
BLADE SURG 15 STRL LF DISP TIS (BLADE) ×1 IMPLANT
BLADE SURG 15 STRL SS (BLADE) ×2
BRIEF STRETCH FOR OB PAD LRG (UNDERPADS AND DIAPERS) ×2 IMPLANT
CONT SPEC 4OZ CLIKSEAL STRL BL (MISCELLANEOUS) ×1 IMPLANT
COVER SURGICAL LIGHT HANDLE (MISCELLANEOUS) ×2 IMPLANT
COVER WAND RF STERILE (DRAPES) IMPLANT
DECANTER SPIKE VIAL GLASS SM (MISCELLANEOUS) ×2 IMPLANT
DRAPE LAPAROTOMY T 98X78 PEDS (DRAPES) ×2 IMPLANT
DRSG PAD ABDOMINAL 8X10 ST (GAUZE/BANDAGES/DRESSINGS) ×1 IMPLANT
ELECT PENCIL ROCKER SW 15FT (MISCELLANEOUS) ×2 IMPLANT
ELECT REM PT RETURN 15FT ADLT (MISCELLANEOUS) ×2 IMPLANT
GAUZE 4X4 16PLY RFD (DISPOSABLE) ×1 IMPLANT
GAUZE SPONGE 4X4 12PLY STRL (GAUZE/BANDAGES/DRESSINGS) ×2 IMPLANT
GLOVE BIO SURGEON STRL SZ 6 (GLOVE) ×1 IMPLANT
GLOVE BIOGEL PI IND STRL 6.5 (GLOVE) IMPLANT
GLOVE BIOGEL PI IND STRL 7.0 (GLOVE) ×1 IMPLANT
GLOVE BIOGEL PI INDICATOR 6.5 (GLOVE) ×1
GLOVE BIOGEL PI INDICATOR 7.0 (GLOVE) ×1
GLOVE SURG SS PI 7.0 STRL IVOR (GLOVE) ×2 IMPLANT
GOWN STRL REUS W/ TWL XL LVL3 (GOWN DISPOSABLE) IMPLANT
GOWN STRL REUS W/TWL XL LVL3 (GOWN DISPOSABLE) ×4
KIT BASIN OR (CUSTOM PROCEDURE TRAY) ×2 IMPLANT
KIT TURNOVER KIT A (KITS) IMPLANT
NEEDLE HYPO 22GX1.5 SAFETY (NEEDLE) ×2 IMPLANT
PACK BASIC VI WITH GOWN DISP (CUSTOM PROCEDURE TRAY) ×2 IMPLANT
SOL PREP PROV IODINE SCRUB 4OZ (MISCELLANEOUS) ×2 IMPLANT
SPONGE HEMORRHOID 8X3CM (HEMOSTASIS) ×1 IMPLANT
SPONGE SURGIFOAM ABS GEL 12-7 (HEMOSTASIS) IMPLANT
SURGILUBE 2OZ TUBE FLIPTOP (MISCELLANEOUS) ×2 IMPLANT
SUT CHROMIC 2 0 SH (SUTURE) IMPLANT
SUT CHROMIC 3 0 SH 27 (SUTURE) IMPLANT
SUT MNCRL AB 4-0 PS2 18 (SUTURE) ×1 IMPLANT
SUT PROLENE 2 0 BLUE (SUTURE) IMPLANT
SUT VIC AB 2-0 SH 27 (SUTURE)
SUT VIC AB 2-0 SH 27X BRD (SUTURE) IMPLANT
SUT VIC AB 2-0 UR6 27 (SUTURE) ×1 IMPLANT
SUT VIC AB 3-0 SH 27 (SUTURE)
SUT VIC AB 3-0 SH 27X BRD (SUTURE) ×1 IMPLANT
SUT VIC AB 4-0 SH 18 (SUTURE) IMPLANT
SYR 20ML LL LF (SYRINGE) ×2 IMPLANT
SYR CONTROL 10ML LL (SYRINGE) ×2 IMPLANT
TOWEL OR 17X26 10 PK STRL BLUE (TOWEL DISPOSABLE) ×2 IMPLANT
TOWEL OR NON WOVEN STRL DISP B (DISPOSABLE) ×2 IMPLANT
YANKAUER SUCT BULB TIP 10FT TU (MISCELLANEOUS) ×2 IMPLANT
YANKAUER SUCT BULB TIP NO VENT (SUCTIONS) ×2 IMPLANT

## 2019-10-12 NOTE — Anesthesia Postprocedure Evaluation (Signed)
Anesthesia Post Note  Patient: Juan Parrish  Procedure(s) Performed: HEMORRHOIDECTOMY AND SKIN TAG EXCISION (N/A Rectum)     Patient location during evaluation: PACU Anesthesia Type: General Level of consciousness: awake and alert Pain management: pain level controlled Vital Signs Assessment: post-procedure vital signs reviewed and stable Respiratory status: spontaneous breathing, nonlabored ventilation, respiratory function stable and patient connected to nasal cannula oxygen Cardiovascular status: blood pressure returned to baseline and stable Postop Assessment: no apparent nausea or vomiting Anesthetic complications: no    Last Vitals:  Vitals:   10/12/19 0906 10/12/19 0930  BP: 132/82 129/78  Pulse:    Resp: 16 18  Temp: 36.4 C 36.5 C  SpO2: 97% 96%    Last Pain:  Vitals:   10/12/19 0930  TempSrc:   PainSc: 0-No pain                 Mashelle Busick

## 2019-10-12 NOTE — Transfer of Care (Signed)
Immediate Anesthesia Transfer of Care Note  Patient: Juan Parrish  Procedure(s) Performed: HEMORRHOIDECTOMY AND SKIN TAG EXCISION (N/A Rectum)  Patient Location: PACU  Anesthesia Type:General  Level of Consciousness: awake, alert  and oriented  Airway & Oxygen Therapy: Patient Spontanous Breathing and Patient connected to face mask oxygen  Post-op Assessment: Report given to RN, Post -op Vital signs reviewed and stable and Patient moving all extremities X 4  Post vital signs: Reviewed and stable  Last Vitals:  Vitals Value Taken Time  BP    Temp    Pulse 80 10/12/19 0828  Resp 8 10/12/19 0828  SpO2 100 % 10/12/19 0828  Vitals shown include unvalidated device data.  Last Pain:  Vitals:   10/12/19 0609  TempSrc: Oral  PainSc:          Complications: No apparent anesthesia complications

## 2019-10-12 NOTE — Progress Notes (Signed)
Dr Kieth Brightly aware Pt's prescription sent to an online pharmacy. He will send new prescription to his preferred pharmacy [facility].

## 2019-10-12 NOTE — Op Note (Signed)
Preoperative diagnosis: External hemorrhoid  Postoperative diagnosis: same   Procedure: anal exam under anesthesia and excisional hemorrhoidectomy, anal skin tag removal  Surgeon: Gurney Maxin, M.D.  Asst: none  Anesthesia: General anesthesia  Indications for procedure: EMMANUAL RAGANS is a 78 y.o. year old male with symptoms of bleeding from hemorrhoid.  Description of procedure: The patient was brought into the operative suite. Anesthesia was administered with general anesthesia. WHO checklist was applied. The patient was then placed in prone position. The area was prepped and draped in the usual sterile fashion.  Marcaine/exparel was infused into the anal verge and perianal space. Next, digital exam was performed. Sphincter location was noted. The patient had a right sided large external hemorrhoid as well as moderate sized right posterior skin tag. A clamp was placed around the hemorrhoid and the hemorrhoid was excised. A 2-0 running suture was used to bring the anal mucosa edges together. Next, the anal skin tag was excised and 4-0 monocryl. Hemostasis was examined and intact. Foam was placed in the anal vault.  Findings: large right sided external hemorrhoid  Specimen: hemorrhoid  Implant: foam   Blood loss: 10 ml  Local anesthesia: 5ml of Exparel:Marcaine Mix  Complications: none  Gurney Maxin, M.D. General, Bariatric, & Minimally Invasive Surgery [facility]

## 2019-10-12 NOTE — Anesthesia Procedure Notes (Signed)
Procedure Name: Intubation Date/Time: 10/12/2019 7:38 AM Performed by: Niel Hummer, CRNA Pre-anesthesia Checklist: Patient identified, Emergency Drugs available, Suction available and Patient being monitored Patient Re-evaluated:Patient Re-evaluated prior to induction Oxygen Delivery Method: Circle system utilized Preoxygenation: Pre-oxygenation with 100% oxygen Induction Type: IV induction Laryngoscope Size: Mac and 4 Grade View: Grade II Tube type: Oral Tube size: 7.5 mm Number of attempts: 1 Airway Equipment and Method: Stylet Placement Confirmation: ETT inserted through vocal cords under direct vision,  positive ETCO2 and breath sounds checked- equal and bilateral Secured at: 22 cm Tube secured with: Tape Dental Injury: Teeth and Oropharynx as per pre-operative assessment

## 2019-10-12 NOTE — H&P (Signed)
Juan Parrish is an 78 y.o. male.   Chief Complaint: bleeding hemorrhoids HPI: 78 yo male with multiple months of bleeding hemorrhoids. He has tried all medical therapy without resolution.  Past Medical History:  Diagnosis Date  . History of eye prosthesis    Right eye  . Hypertension   . Testicular cancer [facility])     Past Surgical History:  Procedure Laterality Date  . BREAST SURGERY     lump removed left breast -benign  . CATARACT EXTRACTION, BILATERAL Bilateral   . HEMORRHOID SURGERY    . ORCHIECTOMY Left   . Right Eye Removed Right   . TONSILLECTOMY      Family History  Problem Relation Age of Onset  . Uterine cancer Mother   . Asthma Father   . Heart disease Father    Social History:  reports that he quit smoking about 6 years ago. His smoking use included cigarettes and cigars. He has a 30.00 pack-year smoking history. He quit smokeless tobacco use about 2 years ago.  His smokeless tobacco use included chew. He reports current alcohol use. He reports that he does not use drugs.  Allergies: No Known Allergies  Medications Prior to Admission  Medication Sig Dispense Refill  . lansoprazole (PREVACID) 30 MG capsule Take 30 mg by mouth daily after supper. Takes around 700  pm    . Multiple Vitamins-Minerals (MULTIVITAMIN WITH MINERALS) tablet Take 1 tablet by mouth daily after supper. Takes around 700 pm    . pravastatin (PRAVACHOL) 40 MG tablet Take 40 mg by mouth daily after supper. Takes around 700 pm    . tamsulosin (FLOMAX) 0.4 MG CAPS capsule Take 0.4 mg by mouth daily after supper. Takes around 700 pm    . valsartan (DIOVAN) 160 MG tablet Take 160 mg by mouth daily after supper. Takes around 700 pm      No results found for this or any previous visit (from the past 48 hour(s)). No results found.  Review of Systems  Constitutional: Negative for chills and fever.  HENT: Negative for hearing loss.   Eyes: Negative for blurred vision and double vision.   Respiratory: Negative for cough and hemoptysis.   Cardiovascular: Negative for chest pain and palpitations.  Gastrointestinal: Positive for blood in stool. Negative for abdominal pain, nausea and vomiting.  Genitourinary: Negative for dysuria and urgency.  Musculoskeletal: Negative for myalgias and neck pain.  Skin: Negative for itching and rash.  Neurological: Negative for dizziness, tingling and headaches.  Endo/Heme/Allergies: Does not bruise/bleed easily.  Psychiatric/Behavioral: Negative for depression and suicidal ideas.    Blood pressure 128/79, pulse 80, temperature 98.4 F (36.9 C), temperature source Oral, resp. rate 18, SpO2 97 %. Physical Exam  Vitals reviewed. Constitutional: He is oriented to person, place, and time. He appears well-developed and well-nourished.  HENT:  Head: Normocephalic and atraumatic.  Eyes: Pupils are equal, round, and reactive to light. Conjunctivae and EOM are normal.  Neck: Normal range of motion. Neck supple.  Cardiovascular: Normal rate and regular rhythm.  Respiratory: Effort normal and breath sounds normal.  GI: Soft. Bowel sounds are normal. He exhibits no distension. There is no abdominal tenderness.  Musculoskeletal: Normal range of motion.  Neurological: He is alert and oriented to person, place, and time.  Skin: Skin is warm and dry.  Psychiatric: He has a normal mood and affect. His behavior is normal.     Assessment/Plan 78 yo male with bleeding external hemorrhoid -hemorrhoidectomy -planned outpatient procedure  Mickeal Skinner, MD 10/12/2019, 7:24 AM

## 2019-10-12 NOTE — Anesthesia Preprocedure Evaluation (Addendum)
Anesthesia Evaluation  Patient identified by MRN, date of birth, ID band Patient awake    Reviewed: Allergy & Precautions, NPO status , Patient's Chart, lab work & pertinent test results  History of Anesthesia Complications Negative for: history of anesthetic complications  Airway Mallampati: II  TM Distance: >3 FB Neck ROM: Full    Dental  (+) Dental Advisory Given, Teeth Intact   Pulmonary neg shortness of breath, asthma , neg recent URI, former smoker,    breath sounds clear to auscultation       Cardiovascular hypertension, Pt. on medications (-) angina(-) Past MI and (-) CHF  Rhythm:Regular  RBBB   Neuro/Psych negative neurological ROS  negative psych ROS   GI/Hepatic Neg liver ROS, hiatal hernia, GERD  Medicated and Controlled,  Endo/Other  negative endocrine ROS  Renal/GU negative Renal ROS     Musculoskeletal negative musculoskeletal ROS (+)   Abdominal   Peds  Hematology negative hematology ROS (+)   Anesthesia Other Findings   Reproductive/Obstetrics                            Anesthesia Physical Anesthesia Plan  ASA: II  Anesthesia Plan: General   Post-op Pain Management:    Induction: Intravenous  PONV Risk Score and Plan: 2 and Dexamethasone and Ondansetron  Airway Management Planned: LMA  Additional Equipment: None  Intra-op Plan:   Post-operative Plan: Extubation in OR  Informed Consent: I have reviewed the patients History and Physical, chart, labs and discussed the procedure including the risks, benefits and alternatives for the proposed anesthesia with the patient or authorized representative who has indicated his/her understanding and acceptance.     Dental advisory given  Plan Discussed with: CRNA and Surgeon  Anesthesia Plan Comments:         Anesthesia Quick Evaluation

## 2019-10-13 ENCOUNTER — Encounter (HOSPITAL_COMMUNITY): Payer: Self-pay | Admitting: General Surgery

## 2019-10-13 LAB — SURGICAL PATHOLOGY

## 2020-02-03 ENCOUNTER — Ambulatory Visit: Payer: Medicare Other | Attending: Internal Medicine

## 2020-02-03 DIAGNOSIS — Z23 Encounter for immunization: Secondary | ICD-10-CM | POA: Insufficient documentation

## 2020-02-03 NOTE — Progress Notes (Signed)
   Covid-19 Vaccination Clinic  Name:  Juan Parrish    MRN: [id number] DOB: 1941-12-14  02/03/2020  Mr. Juan Parrish was observed post Covid-19 immunization for 15 minutes without incidence. He was provided with Vaccine Information Sheet and instruction to access the V-Safe system.   Mr. Juan Parrish was instructed to call 911 with any severe reactions post vaccine: Marland Kitchen Difficulty breathing  . Swelling of your face and throat  . A fast heartbeat  . A bad rash all over your body  . Dizziness and weakness    Immunizations Administered    Name Date Dose VIS Date Route   Pfizer COVID-19 Vaccine 02/03/2020 12:32 PM 0.3 mL 12/01/2019 Intramuscular   Manufacturer: [place]   Lot: [id number]   [place]: [id number]

## 2020-02-25 ENCOUNTER — Ambulatory Visit: Payer: Medicare Other | Attending: Internal Medicine

## 2020-02-25 DIAGNOSIS — Z23 Encounter for immunization: Secondary | ICD-10-CM | POA: Insufficient documentation

## 2020-02-25 NOTE — Progress Notes (Signed)
   Covid-19 Vaccination Clinic  Name:  LAVI NITKA    MRN: [id number] DOB: [birth year]  02/25/2020  Mr. Schwalbach was observed post Covid-19 immunization for 15 minutes without incident. He was provided with Vaccine Information Sheet and instruction to access the V-Safe system.   Mr. Kulikowski was instructed to call 911 with any severe reactions post vaccine: Marland Kitchen Difficulty breathing  . Swelling of face and throat  . A fast heartbeat  . A bad rash all over body  . Dizziness and weakness   Immunizations Administered    Name Date Dose VIS Date Route   Pfizer COVID-19 Vaccine 02/25/2020  3:49 PM 0.3 mL 12/01/2019 Intramuscular   Manufacturer: [place]   Lot: [id number]   [place]: [id number]

## 2020-12-23 DIAGNOSIS — Z20822 Contact with and (suspected) exposure to covid-19: Secondary | ICD-10-CM | POA: Diagnosis not present

## 2020-12-25 DIAGNOSIS — D123 Benign neoplasm of transverse colon: Secondary | ICD-10-CM | POA: Diagnosis not present

## 2020-12-25 DIAGNOSIS — Z86010 Personal history of colonic polyps: Secondary | ICD-10-CM | POA: Diagnosis not present

## 2020-12-25 DIAGNOSIS — K5730 Diverticulosis of large intestine without perforation or abscess without bleeding: Secondary | ICD-10-CM | POA: Diagnosis not present

## 2020-12-25 DIAGNOSIS — K635 Polyp of colon: Secondary | ICD-10-CM | POA: Diagnosis not present

## 2020-12-25 DIAGNOSIS — D125 Benign neoplasm of sigmoid colon: Secondary | ICD-10-CM | POA: Diagnosis not present

## 2020-12-25 DIAGNOSIS — K921 Melena: Secondary | ICD-10-CM | POA: Diagnosis not present

## 2020-12-25 DIAGNOSIS — D122 Benign neoplasm of ascending colon: Secondary | ICD-10-CM | POA: Diagnosis not present

## 2020-12-25 DIAGNOSIS — K621 Rectal polyp: Secondary | ICD-10-CM | POA: Diagnosis not present

## 2020-12-27 DIAGNOSIS — K635 Polyp of colon: Secondary | ICD-10-CM | POA: Diagnosis not present

## 2020-12-27 DIAGNOSIS — D123 Benign neoplasm of transverse colon: Secondary | ICD-10-CM | POA: Diagnosis not present

## 2020-12-27 DIAGNOSIS — K621 Rectal polyp: Secondary | ICD-10-CM | POA: Diagnosis not present

## 2020-12-27 DIAGNOSIS — D125 Benign neoplasm of sigmoid colon: Secondary | ICD-10-CM | POA: Diagnosis not present

## 2020-12-27 DIAGNOSIS — D122 Benign neoplasm of ascending colon: Secondary | ICD-10-CM | POA: Diagnosis not present

## 2021-04-08 DIAGNOSIS — E7800 Pure hypercholesterolemia, unspecified: Secondary | ICD-10-CM | POA: Diagnosis not present

## 2021-04-08 DIAGNOSIS — I10 Essential (primary) hypertension: Secondary | ICD-10-CM | POA: Diagnosis not present

## 2021-04-08 DIAGNOSIS — Z970 Presence of artificial eye: Secondary | ICD-10-CM | POA: Diagnosis not present

## 2021-04-08 DIAGNOSIS — Z1389 Encounter for screening for other disorder: Secondary | ICD-10-CM | POA: Diagnosis not present

## 2021-04-08 DIAGNOSIS — K219 Gastro-esophageal reflux disease without esophagitis: Secondary | ICD-10-CM | POA: Diagnosis not present

## 2021-04-08 DIAGNOSIS — Z0000 Encounter for general adult medical examination without abnormal findings: Secondary | ICD-10-CM | POA: Diagnosis not present

## 2021-04-08 DIAGNOSIS — J309 Allergic rhinitis, unspecified: Secondary | ICD-10-CM | POA: Diagnosis not present

## 2021-04-08 DIAGNOSIS — K5909 Other constipation: Secondary | ICD-10-CM | POA: Diagnosis not present

## 2021-04-08 DIAGNOSIS — F17201 Nicotine dependence, unspecified, in remission: Secondary | ICD-10-CM | POA: Diagnosis not present

## 2021-04-22 DIAGNOSIS — H2702 Aphakia, left eye: Secondary | ICD-10-CM | POA: Diagnosis not present

## 2021-04-22 DIAGNOSIS — H1820 Unspecified corneal edema: Secondary | ICD-10-CM | POA: Diagnosis not present

## 2021-04-22 DIAGNOSIS — H16142 Punctate keratitis, left eye: Secondary | ICD-10-CM | POA: Diagnosis not present

## 2021-04-22 DIAGNOSIS — H43812 Vitreous degeneration, left eye: Secondary | ICD-10-CM | POA: Diagnosis not present

## 2021-06-26 DIAGNOSIS — D3611 Benign neoplasm of peripheral nerves and autonomic nervous system of face, head, and neck: Secondary | ICD-10-CM | POA: Diagnosis not present

## 2021-06-26 DIAGNOSIS — L565 Disseminated superficial actinic porokeratosis (DSAP): Secondary | ICD-10-CM | POA: Diagnosis not present

## 2021-06-26 DIAGNOSIS — L821 Other seborrheic keratosis: Secondary | ICD-10-CM | POA: Diagnosis not present

## 2021-06-26 DIAGNOSIS — D485 Neoplasm of uncertain behavior of skin: Secondary | ICD-10-CM | POA: Diagnosis not present

## 2021-06-26 DIAGNOSIS — D1801 Hemangioma of skin and subcutaneous tissue: Secondary | ICD-10-CM | POA: Diagnosis not present

## 2021-06-26 DIAGNOSIS — D170 Benign lipomatous neoplasm of skin and subcutaneous tissue of head, face and neck: Secondary | ICD-10-CM | POA: Diagnosis not present

## 2021-06-26 DIAGNOSIS — L570 Actinic keratosis: Secondary | ICD-10-CM | POA: Diagnosis not present

## 2021-06-26 DIAGNOSIS — L814 Other melanin hyperpigmentation: Secondary | ICD-10-CM | POA: Diagnosis not present

## 2021-06-26 DIAGNOSIS — D225 Melanocytic nevi of trunk: Secondary | ICD-10-CM | POA: Diagnosis not present

## 2021-09-18 DIAGNOSIS — Z23 Encounter for immunization: Secondary | ICD-10-CM | POA: Diagnosis not present

## 2021-09-18 DIAGNOSIS — M6208 Separation of muscle (nontraumatic), other site: Secondary | ICD-10-CM | POA: Diagnosis not present

## 2021-10-20 DIAGNOSIS — K5909 Other constipation: Secondary | ICD-10-CM | POA: Diagnosis not present

## 2021-10-20 DIAGNOSIS — E7800 Pure hypercholesterolemia, unspecified: Secondary | ICD-10-CM | POA: Diagnosis not present

## 2021-10-20 DIAGNOSIS — J309 Allergic rhinitis, unspecified: Secondary | ICD-10-CM | POA: Diagnosis not present

## 2021-10-20 DIAGNOSIS — Z970 Presence of artificial eye: Secondary | ICD-10-CM | POA: Diagnosis not present

## 2021-10-20 DIAGNOSIS — I10 Essential (primary) hypertension: Secondary | ICD-10-CM | POA: Diagnosis not present

## 2021-10-20 DIAGNOSIS — K219 Gastro-esophageal reflux disease without esophagitis: Secondary | ICD-10-CM | POA: Diagnosis not present

## 2021-10-20 DIAGNOSIS — F17201 Nicotine dependence, unspecified, in remission: Secondary | ICD-10-CM | POA: Diagnosis not present

## 2021-11-01 DIAGNOSIS — Z20822 Contact with and (suspected) exposure to covid-19: Secondary | ICD-10-CM | POA: Diagnosis not present

## 2022-04-28 DIAGNOSIS — H43812 Vitreous degeneration, left eye: Secondary | ICD-10-CM | POA: Diagnosis not present

## 2022-05-29 DIAGNOSIS — H10212 Acute toxic conjunctivitis, left eye: Secondary | ICD-10-CM | POA: Diagnosis not present

## 2022-07-01 DIAGNOSIS — D224 Melanocytic nevi of scalp and neck: Secondary | ICD-10-CM | POA: Diagnosis not present

## 2022-07-01 DIAGNOSIS — L821 Other seborrheic keratosis: Secondary | ICD-10-CM | POA: Diagnosis not present

## 2022-07-01 DIAGNOSIS — D225 Melanocytic nevi of trunk: Secondary | ICD-10-CM | POA: Diagnosis not present

## 2022-07-01 DIAGNOSIS — L820 Inflamed seborrheic keratosis: Secondary | ICD-10-CM | POA: Diagnosis not present

## 2022-07-01 DIAGNOSIS — Z85828 Personal history of other malignant neoplasm of skin: Secondary | ICD-10-CM | POA: Diagnosis not present

## 2022-07-01 DIAGNOSIS — C44622 Squamous cell carcinoma of skin of right upper limb, including shoulder: Secondary | ICD-10-CM | POA: Diagnosis not present

## 2022-07-01 DIAGNOSIS — L814 Other melanin hyperpigmentation: Secondary | ICD-10-CM | POA: Diagnosis not present

## 2022-07-01 DIAGNOSIS — D3617 Benign neoplasm of peripheral nerves and autonomic nervous system of trunk, unspecified: Secondary | ICD-10-CM | POA: Diagnosis not present

## 2022-07-24 DIAGNOSIS — K5909 Other constipation: Secondary | ICD-10-CM | POA: Diagnosis not present

## 2022-07-24 DIAGNOSIS — F17201 Nicotine dependence, unspecified, in remission: Secondary | ICD-10-CM | POA: Diagnosis not present

## 2022-07-24 DIAGNOSIS — J309 Allergic rhinitis, unspecified: Secondary | ICD-10-CM | POA: Diagnosis not present

## 2022-07-24 DIAGNOSIS — I10 Essential (primary) hypertension: Secondary | ICD-10-CM | POA: Diagnosis not present

## 2022-07-24 DIAGNOSIS — E7800 Pure hypercholesterolemia, unspecified: Secondary | ICD-10-CM | POA: Diagnosis not present

## 2022-07-24 DIAGNOSIS — Z0000 Encounter for general adult medical examination without abnormal findings: Secondary | ICD-10-CM | POA: Diagnosis not present

## 2022-07-24 DIAGNOSIS — K219 Gastro-esophageal reflux disease without esophagitis: Secondary | ICD-10-CM | POA: Diagnosis not present

## 2022-09-22 ENCOUNTER — Other Ambulatory Visit: Payer: Self-pay | Admitting: *Deleted

## 2022-09-22 DIAGNOSIS — Z122 Encounter for screening for malignant neoplasm of respiratory organs: Secondary | ICD-10-CM

## 2022-09-22 DIAGNOSIS — Z87891 Personal history of nicotine dependence: Secondary | ICD-10-CM

## 2022-10-21 ENCOUNTER — Ambulatory Visit (INDEPENDENT_AMBULATORY_CARE_PROVIDER_SITE_OTHER): Payer: Medicare Other | Admitting: Acute Care

## 2022-10-21 ENCOUNTER — Encounter: Payer: Self-pay | Admitting: Acute Care

## 2022-10-21 DIAGNOSIS — F17211 Nicotine dependence, cigarettes, in remission: Secondary | ICD-10-CM | POA: Diagnosis not present

## 2022-10-21 DIAGNOSIS — Z87891 Personal history of nicotine dependence: Secondary | ICD-10-CM

## 2022-10-21 NOTE — Patient Instructions (Signed)
Thank you for participating in the [facility]. It was our pleasure to meet you today. We will call you with the results of your scan within the next few days. Your scan will be assigned a Lung RADS category score by the physicians reading the scans.  This Lung RADS score determines follow up scanning.  See below for description of categories, and follow up screening recommendations. We will be in touch to schedule your follow up screening annually or based on recommendations of our providers. We will fax a copy of your scan results to your Primary Care Physician, or the physician who referred you to the program, to ensure they have the results. Please call the office if you have any questions or concerns regarding your scanning experience or results.  Our office number is [phone]. Please speak with Denise Phelps, RN. , or  Denise Buckner RN, They are  our Lung Cancer Screening RN.'s If They are unavailable when you call, Please leave a message on the voice mail. We will return your call at our earliest convenience.This voice mail is monitored several times a day.  Remember, if your scan is normal, we will scan you annually as long as you continue to meet the criteria for the program. (Age 55-77, Current smoker or smoker who has quit within the last 15 years). If you are a smoker, remember, quitting is the single most powerful action that you can take to decrease your risk of lung cancer and other pulmonary, breathing related problems. We know quitting is hard, and we are here to help.  Please let us know if there is anything we can do to help you meet your goal of quitting. If you are a former smoker, congratulations. We are proud of you! Remain smoke free! Remember you can refer friends or family members through the number above.  We will screen them to make sure they meet criteria for the program. Thank you for helping us take better care of you by  participating in Lung Screening.  You can receive free nicotine replacement therapy ( patches, gum or mints) by calling 1-800-QUIT NOW. Please call so we can get you on the path to becoming  a non-smoker. I know it is hard, but you can do this!  Lung RADS Categories:  Lung RADS 1: no nodules or definitely non-concerning nodules.  Recommendation is for a repeat annual scan in 12 months.  Lung RADS 2:  nodules that are non-concerning in appearance and behavior with a very low likelihood of becoming an active cancer. Recommendation is for a repeat annual scan in 12 months.  Lung RADS 3: nodules that are probably non-concerning , includes nodules with a low likelihood of becoming an active cancer.  Recommendation is for a 6-month repeat screening scan. Often noted after an upper respiratory illness. We will be in touch to make sure you have no questions, and to schedule your 6-month scan.  Lung RADS 4 A: nodules with concerning findings, recommendation is most often for a follow up scan in 3 months or additional testing based on our provider's assessment of the scan. We will be in touch to make sure you have no questions and to schedule the recommended 3 month follow up scan.  Lung RADS 4 B:  indicates findings that are concerning. We will be in touch with you to schedule additional diagnostic testing based on our provider's  assessment of the scan.  Other options for assistance in smoking cessation (   As covered by your insurance benefits)  Hypnosis for smoking cessation  [employer] [phone]  Acupuncture for smoking cessation  [facility] [phone]   

## 2022-10-21 NOTE — Progress Notes (Signed)
Virtual Visit via Video Note  I connected with Juan Parrish on 10/21/22 at 10:00 AM EDT by a video enabled telemedicine application and verified that I am speaking with the correct person using two identifiers.  Location: Patient: At home Provider:  [address], [place], [place], [address]    I discussed the limitations of evaluation and management by telemedicine and the availability of in person appointments. The patient expressed understanding and agreed to proceed.   Shared Decision Making Visit Lung Cancer Screening Program [phone])   Eligibility: Age 81 y.o. Pack Years Smoking History Calculation 37 pack year smoking history (# packs/per year x # years smoked) Recent History of coughing up blood  no Unexplained weight loss? no ( >Than 15 pounds within the last 6 months ) Prior History Lung / other cancer no (Diagnosis within the last 5 years already requiring surveillance chest CT Scans). Smoking Status Former Smoker Former Smokers: Years since quit: 5 years  Quit Date: 2018  Visit Components: Discussion included one or more decision making aids. yes Discussion included risk/benefits of screening. yes Discussion included potential follow up diagnostic testing for abnormal scans. yes Discussion included meaning and risk of over diagnosis. yes Discussion included meaning and risk of False Positives. yes Discussion included meaning of total radiation exposure. yes  Counseling Included: Importance of adherence to annual lung cancer LDCT screening. yes Impact of comorbidities on ability to participate in the program. yes Ability and willingness to under diagnostic treatment. yes  Smoking Cessation Counseling: Current Smokers:  Discussed importance of smoking cessation. yes Information about tobacco cessation classes and interventions provided to patient. yes Patient provided with "ticket" for LDCT Scan. yes Symptomatic Patient. no  Counseling NA Diagnosis  Code: Tobacco Use Z72.0 Asymptomatic Patient yes  Counseling (Intermediate counseling: > three minutes counseling) [id number] Former Smokers:  Discussed the importance of maintaining cigarette abstinence. yes Diagnosis Code: Personal History of Nicotine Dependence. V78.588 Information about tobacco cessation classes and interventions provided to patient. Yes Patient provided with "ticket" for LDCT Scan. yes Written Order for Lung Cancer Screening with LDCT placed in Epic. Yes (CT Chest Lung Cancer Screening Low Dose W/O CM) [id number] Z12.2-Screening of respiratory organs Z87.891-Personal history of nicotine dependence  I spent 25 minutes of face to face time/virtual visit time  with  Juan Parrish discussing the risks and benefits of lung cancer screening. We took the time to pause the power point at intervals to allow for questions to be asked and answered to ensure understanding. We discussed that he had taken the single most powerful action possible to decrease his risk of developing lung cancer when he quit smoking. I counseled him to remain smoke free, and to contact me if he ever had the desire to smoke again so that I can provide resources and tools to help support the effort to remain smoke free. We discussed the time and location of the scan, and that either  Doroteo Glassman RN, Joella Prince, RN or I  or I will call / send a letter with the results within  24-72 hours of receiving them. He has the office contact information in the event he needs to speak with me,  he verbalized understanding of all of the above and had no further questions upon leaving the office.     I explained to the patient that there has been a high incidence of coronary artery disease noted on these exams. I explained that this is a non-gated exam therefore degree or  severity cannot be determined. This patient is on statin therapy. I have asked the patient to follow-up with their PCP regarding any incidental finding of  coronary artery disease and management with diet or medication as they feel is clinically indicated. The patient verbalized understanding of the above and had no further questions.     Magdalen Spatz, NP 10/21/2022

## 2022-10-22 ENCOUNTER — Ambulatory Visit
Admission: RE | Admit: 2022-10-22 | Discharge: 2022-10-22 | Disposition: A | Discharge status: Home / Self Care | Payer: Medicare Other | Source: Ambulatory Visit | Attending: Acute Care | Admitting: Acute Care

## 2022-10-22 DIAGNOSIS — Z87891 Personal history of nicotine dependence: Secondary | ICD-10-CM

## 2022-10-22 DIAGNOSIS — Z122 Encounter for screening for malignant neoplasm of respiratory organs: Secondary | ICD-10-CM

## 2022-10-26 ENCOUNTER — Other Ambulatory Visit: Payer: Self-pay | Admitting: Acute Care

## 2022-10-26 DIAGNOSIS — Z87891 Personal history of nicotine dependence: Secondary | ICD-10-CM

## 2022-10-26 DIAGNOSIS — Z122 Encounter for screening for malignant neoplasm of respiratory organs: Secondary | ICD-10-CM

## 2023-01-29 DIAGNOSIS — Z23 Encounter for immunization: Secondary | ICD-10-CM | POA: Diagnosis not present

## 2023-01-29 DIAGNOSIS — J439 Emphysema, unspecified: Secondary | ICD-10-CM | POA: Diagnosis not present

## 2023-01-29 DIAGNOSIS — I10 Essential (primary) hypertension: Secondary | ICD-10-CM | POA: Diagnosis not present

## 2023-01-29 DIAGNOSIS — J069 Acute upper respiratory infection, unspecified: Secondary | ICD-10-CM | POA: Diagnosis not present

## 2023-01-29 DIAGNOSIS — J45991 Cough variant asthma: Secondary | ICD-10-CM | POA: Diagnosis not present

## 2023-05-11 DIAGNOSIS — H1045 Other chronic allergic conjunctivitis: Secondary | ICD-10-CM | POA: Diagnosis not present

## 2023-05-11 DIAGNOSIS — H2702 Aphakia, left eye: Secondary | ICD-10-CM | POA: Diagnosis not present

## 2023-05-11 DIAGNOSIS — H43812 Vitreous degeneration, left eye: Secondary | ICD-10-CM | POA: Diagnosis not present

## 2023-08-11 DIAGNOSIS — K219 Gastro-esophageal reflux disease without esophagitis: Secondary | ICD-10-CM | POA: Diagnosis not present

## 2023-08-11 DIAGNOSIS — I77811 Abdominal aortic ectasia: Secondary | ICD-10-CM | POA: Diagnosis not present

## 2023-08-11 DIAGNOSIS — E7800 Pure hypercholesterolemia, unspecified: Secondary | ICD-10-CM | POA: Diagnosis not present

## 2023-08-11 DIAGNOSIS — Z122 Encounter for screening for malignant neoplasm of respiratory organs: Secondary | ICD-10-CM | POA: Diagnosis not present

## 2023-08-11 DIAGNOSIS — F17201 Nicotine dependence, unspecified, in remission: Secondary | ICD-10-CM | POA: Diagnosis not present

## 2023-08-11 DIAGNOSIS — I10 Essential (primary) hypertension: Secondary | ICD-10-CM | POA: Diagnosis not present

## 2023-08-11 DIAGNOSIS — Z0000 Encounter for general adult medical examination without abnormal findings: Secondary | ICD-10-CM | POA: Diagnosis not present

## 2023-08-11 DIAGNOSIS — J45991 Cough variant asthma: Secondary | ICD-10-CM | POA: Diagnosis not present

## 2023-10-26 ENCOUNTER — Inpatient Hospital Stay
Admission: RE | Admit: 2023-10-26 | Discharge: 2023-10-26 | Disposition: A | Discharge status: Home / Self Care | Payer: Medicare Other | Source: Ambulatory Visit | Attending: Thoracic Surgery (Cardiothoracic Vascular Surgery) | Admitting: Thoracic Surgery (Cardiothoracic Vascular Surgery)

## 2023-10-26 DIAGNOSIS — Z87891 Personal history of nicotine dependence: Secondary | ICD-10-CM

## 2023-10-26 DIAGNOSIS — Z122 Encounter for screening for malignant neoplasm of respiratory organs: Secondary | ICD-10-CM

## 2023-10-26 DIAGNOSIS — Z136 Encounter for screening for cardiovascular disorders: Secondary | ICD-10-CM | POA: Diagnosis not present

## 2024-01-06 DIAGNOSIS — M7071 Other bursitis of hip, right hip: Secondary | ICD-10-CM | POA: Diagnosis not present

## 2024-03-30 DIAGNOSIS — M25551 Pain in right hip: Secondary | ICD-10-CM | POA: Diagnosis not present

## 2024-03-30 DIAGNOSIS — M545 Low back pain, unspecified: Secondary | ICD-10-CM | POA: Diagnosis not present

## 2024-04-02 DIAGNOSIS — M545 Low back pain, unspecified: Secondary | ICD-10-CM | POA: Diagnosis not present

## 2024-04-19 DIAGNOSIS — M47816 Spondylosis without myelopathy or radiculopathy, lumbar region: Secondary | ICD-10-CM | POA: Diagnosis not present

## 2024-04-19 DIAGNOSIS — M7071 Other bursitis of hip, right hip: Secondary | ICD-10-CM | POA: Diagnosis not present

## 2024-04-26 DIAGNOSIS — M7071 Other bursitis of hip, right hip: Secondary | ICD-10-CM | POA: Diagnosis not present

## 2024-06-13 DIAGNOSIS — M7071 Other bursitis of hip, right hip: Secondary | ICD-10-CM | POA: Diagnosis not present

## 2024-06-13 DIAGNOSIS — M47816 Spondylosis without myelopathy or radiculopathy, lumbar region: Secondary | ICD-10-CM | POA: Diagnosis not present

## 2024-06-13 DIAGNOSIS — M7918 Myalgia, other site: Secondary | ICD-10-CM | POA: Diagnosis not present

## 2024-06-15 DIAGNOSIS — M7918 Myalgia, other site: Secondary | ICD-10-CM | POA: Diagnosis not present

## 2024-09-04 DIAGNOSIS — Z8547 Personal history of malignant neoplasm of testis: Secondary | ICD-10-CM | POA: Diagnosis not present

## 2024-09-04 DIAGNOSIS — Z1331 Encounter for screening for depression: Secondary | ICD-10-CM | POA: Diagnosis not present

## 2024-09-04 DIAGNOSIS — K219 Gastro-esophageal reflux disease without esophagitis: Secondary | ICD-10-CM | POA: Diagnosis not present

## 2024-09-04 DIAGNOSIS — R972 Elevated prostate specific antigen [PSA]: Secondary | ICD-10-CM | POA: Diagnosis not present

## 2024-09-04 DIAGNOSIS — N400 Enlarged prostate without lower urinary tract symptoms: Secondary | ICD-10-CM | POA: Diagnosis not present

## 2024-09-04 DIAGNOSIS — E7800 Pure hypercholesterolemia, unspecified: Secondary | ICD-10-CM | POA: Diagnosis not present

## 2024-09-04 DIAGNOSIS — Z0000 Encounter for general adult medical examination without abnormal findings: Secondary | ICD-10-CM | POA: Diagnosis not present

## 2024-09-04 DIAGNOSIS — I700 Atherosclerosis of aorta: Secondary | ICD-10-CM | POA: Diagnosis not present

## 2024-09-04 DIAGNOSIS — J439 Emphysema, unspecified: Secondary | ICD-10-CM | POA: Diagnosis not present

## 2024-09-04 DIAGNOSIS — J45991 Cough variant asthma: Secondary | ICD-10-CM | POA: Diagnosis not present

## 2024-09-04 DIAGNOSIS — N529 Male erectile dysfunction, unspecified: Secondary | ICD-10-CM | POA: Diagnosis not present

## 2024-09-04 DIAGNOSIS — I10 Essential (primary) hypertension: Secondary | ICD-10-CM | POA: Diagnosis not present
